# Patient Record
Sex: Male | Born: 1955
Health system: Southern US, Community
[De-identification: ages and names within clinical notes are randomized; demographics above are authoritative.]

## PROBLEM LIST (undated history)

## (undated) DIAGNOSIS — M1990 Unspecified osteoarthritis, unspecified site: Secondary | ICD-10-CM

## (undated) DIAGNOSIS — K219 Gastro-esophageal reflux disease without esophagitis: Secondary | ICD-10-CM

## (undated) DIAGNOSIS — M419 Scoliosis, unspecified: Secondary | ICD-10-CM

## (undated) DIAGNOSIS — E785 Hyperlipidemia, unspecified: Secondary | ICD-10-CM

## (undated) DIAGNOSIS — M810 Age-related osteoporosis without current pathological fracture: Secondary | ICD-10-CM

## (undated) DIAGNOSIS — I10 Essential (primary) hypertension: Secondary | ICD-10-CM

## (undated) DIAGNOSIS — J449 Chronic obstructive pulmonary disease, unspecified: Secondary | ICD-10-CM

## (undated) DIAGNOSIS — Z8619 Personal history of other infectious and parasitic diseases: Secondary | ICD-10-CM

## (undated) DIAGNOSIS — F411 Generalized anxiety disorder: Secondary | ICD-10-CM

## (undated) HISTORY — DX: Essential (primary) hypertension: I10

## (undated) HISTORY — PX: OTHER SURGICAL HISTORY: SHX169

## (undated) HISTORY — DX: Hyperlipidemia, unspecified: E78.5

## (undated) HISTORY — PX: CHOLECYSTECTOMY: SHX55

## (undated) HISTORY — DX: Gastro-esophageal reflux disease without esophagitis: K21.9

## (undated) HISTORY — DX: Personal history of other infectious and parasitic diseases: Z86.19

## (undated) HISTORY — DX: Generalized anxiety disorder: F41.1

---

## 2016-05-18 DIAGNOSIS — C7931 Secondary malignant neoplasm of brain: Secondary | ICD-10-CM | POA: Diagnosis not present

## 2016-05-18 DIAGNOSIS — C20 Malignant neoplasm of rectum: Secondary | ICD-10-CM | POA: Diagnosis not present

## 2016-05-19 DIAGNOSIS — C20 Malignant neoplasm of rectum: Secondary | ICD-10-CM | POA: Diagnosis not present

## 2016-05-19 DIAGNOSIS — C7931 Secondary malignant neoplasm of brain: Secondary | ICD-10-CM | POA: Diagnosis not present

## 2016-05-20 DIAGNOSIS — C20 Malignant neoplasm of rectum: Secondary | ICD-10-CM | POA: Diagnosis not present

## 2016-05-20 DIAGNOSIS — C7931 Secondary malignant neoplasm of brain: Secondary | ICD-10-CM | POA: Diagnosis not present

## 2016-05-21 DIAGNOSIS — C20 Malignant neoplasm of rectum: Secondary | ICD-10-CM | POA: Diagnosis not present

## 2016-05-21 DIAGNOSIS — C7931 Secondary malignant neoplasm of brain: Secondary | ICD-10-CM | POA: Diagnosis not present

## 2016-05-22 DIAGNOSIS — C20 Malignant neoplasm of rectum: Secondary | ICD-10-CM | POA: Diagnosis not present

## 2016-05-22 DIAGNOSIS — C7931 Secondary malignant neoplasm of brain: Secondary | ICD-10-CM | POA: Diagnosis not present

## 2016-05-23 DIAGNOSIS — C7931 Secondary malignant neoplasm of brain: Secondary | ICD-10-CM | POA: Diagnosis not present

## 2016-05-23 DIAGNOSIS — C20 Malignant neoplasm of rectum: Secondary | ICD-10-CM | POA: Diagnosis not present

## 2016-05-24 DIAGNOSIS — C20 Malignant neoplasm of rectum: Secondary | ICD-10-CM | POA: Diagnosis not present

## 2016-05-24 DIAGNOSIS — C7931 Secondary malignant neoplasm of brain: Secondary | ICD-10-CM | POA: Diagnosis not present

## 2016-05-25 DIAGNOSIS — C20 Malignant neoplasm of rectum: Secondary | ICD-10-CM | POA: Diagnosis not present

## 2016-05-25 DIAGNOSIS — C7931 Secondary malignant neoplasm of brain: Secondary | ICD-10-CM | POA: Diagnosis not present

## 2016-05-26 DIAGNOSIS — C20 Malignant neoplasm of rectum: Secondary | ICD-10-CM | POA: Diagnosis not present

## 2016-05-26 DIAGNOSIS — C7931 Secondary malignant neoplasm of brain: Secondary | ICD-10-CM | POA: Diagnosis not present

## 2016-05-27 DIAGNOSIS — C7931 Secondary malignant neoplasm of brain: Secondary | ICD-10-CM | POA: Diagnosis not present

## 2016-05-27 DIAGNOSIS — C20 Malignant neoplasm of rectum: Secondary | ICD-10-CM | POA: Diagnosis not present

## 2016-05-28 DIAGNOSIS — C20 Malignant neoplasm of rectum: Secondary | ICD-10-CM | POA: Diagnosis not present

## 2016-05-28 DIAGNOSIS — C7931 Secondary malignant neoplasm of brain: Secondary | ICD-10-CM | POA: Diagnosis not present

## 2016-05-29 DIAGNOSIS — C20 Malignant neoplasm of rectum: Secondary | ICD-10-CM | POA: Diagnosis not present

## 2016-05-29 DIAGNOSIS — C7931 Secondary malignant neoplasm of brain: Secondary | ICD-10-CM | POA: Diagnosis not present

## 2016-05-30 DIAGNOSIS — C20 Malignant neoplasm of rectum: Secondary | ICD-10-CM | POA: Diagnosis not present

## 2016-05-30 DIAGNOSIS — C7931 Secondary malignant neoplasm of brain: Secondary | ICD-10-CM | POA: Diagnosis not present

## 2016-05-31 DIAGNOSIS — C20 Malignant neoplasm of rectum: Secondary | ICD-10-CM | POA: Diagnosis not present

## 2016-05-31 DIAGNOSIS — C7931 Secondary malignant neoplasm of brain: Secondary | ICD-10-CM | POA: Diagnosis not present

## 2016-06-01 DIAGNOSIS — C7931 Secondary malignant neoplasm of brain: Secondary | ICD-10-CM | POA: Diagnosis not present

## 2016-06-01 DIAGNOSIS — C20 Malignant neoplasm of rectum: Secondary | ICD-10-CM | POA: Diagnosis not present

## 2016-06-02 DIAGNOSIS — C7931 Secondary malignant neoplasm of brain: Secondary | ICD-10-CM | POA: Diagnosis not present

## 2016-06-02 DIAGNOSIS — C20 Malignant neoplasm of rectum: Secondary | ICD-10-CM | POA: Diagnosis not present

## 2016-06-03 DIAGNOSIS — C20 Malignant neoplasm of rectum: Secondary | ICD-10-CM | POA: Diagnosis not present

## 2016-06-03 DIAGNOSIS — C7931 Secondary malignant neoplasm of brain: Secondary | ICD-10-CM | POA: Diagnosis not present

## 2016-06-04 DIAGNOSIS — C20 Malignant neoplasm of rectum: Secondary | ICD-10-CM | POA: Diagnosis not present

## 2016-06-04 DIAGNOSIS — C7931 Secondary malignant neoplasm of brain: Secondary | ICD-10-CM | POA: Diagnosis not present

## 2016-06-05 DIAGNOSIS — C20 Malignant neoplasm of rectum: Secondary | ICD-10-CM | POA: Diagnosis not present

## 2016-06-05 DIAGNOSIS — C7931 Secondary malignant neoplasm of brain: Secondary | ICD-10-CM | POA: Diagnosis not present

## 2016-06-06 DIAGNOSIS — C7931 Secondary malignant neoplasm of brain: Secondary | ICD-10-CM | POA: Diagnosis not present

## 2016-06-06 DIAGNOSIS — C20 Malignant neoplasm of rectum: Secondary | ICD-10-CM | POA: Diagnosis not present

## 2016-06-07 DIAGNOSIS — C7931 Secondary malignant neoplasm of brain: Secondary | ICD-10-CM | POA: Diagnosis not present

## 2016-06-07 DIAGNOSIS — C20 Malignant neoplasm of rectum: Secondary | ICD-10-CM | POA: Diagnosis not present

## 2016-06-08 DIAGNOSIS — C20 Malignant neoplasm of rectum: Secondary | ICD-10-CM | POA: Diagnosis not present

## 2016-06-08 DIAGNOSIS — C7931 Secondary malignant neoplasm of brain: Secondary | ICD-10-CM | POA: Diagnosis not present

## 2016-06-09 DIAGNOSIS — C20 Malignant neoplasm of rectum: Secondary | ICD-10-CM | POA: Diagnosis not present

## 2016-06-09 DIAGNOSIS — C7931 Secondary malignant neoplasm of brain: Secondary | ICD-10-CM | POA: Diagnosis not present

## 2016-06-10 DIAGNOSIS — C7931 Secondary malignant neoplasm of brain: Secondary | ICD-10-CM | POA: Diagnosis not present

## 2016-06-10 DIAGNOSIS — C20 Malignant neoplasm of rectum: Secondary | ICD-10-CM | POA: Diagnosis not present

## 2016-06-11 DIAGNOSIS — C20 Malignant neoplasm of rectum: Secondary | ICD-10-CM | POA: Diagnosis not present

## 2016-06-11 DIAGNOSIS — C7931 Secondary malignant neoplasm of brain: Secondary | ICD-10-CM | POA: Diagnosis not present

## 2016-06-12 DIAGNOSIS — C7931 Secondary malignant neoplasm of brain: Secondary | ICD-10-CM | POA: Diagnosis not present

## 2016-06-12 DIAGNOSIS — C20 Malignant neoplasm of rectum: Secondary | ICD-10-CM | POA: Diagnosis not present

## 2016-06-13 DIAGNOSIS — C20 Malignant neoplasm of rectum: Secondary | ICD-10-CM | POA: Diagnosis not present

## 2016-06-13 DIAGNOSIS — C7931 Secondary malignant neoplasm of brain: Secondary | ICD-10-CM | POA: Diagnosis not present

## 2016-06-14 DIAGNOSIS — C7931 Secondary malignant neoplasm of brain: Secondary | ICD-10-CM | POA: Diagnosis not present

## 2016-06-14 DIAGNOSIS — C20 Malignant neoplasm of rectum: Secondary | ICD-10-CM | POA: Diagnosis not present

## 2016-06-15 DIAGNOSIS — C20 Malignant neoplasm of rectum: Secondary | ICD-10-CM | POA: Diagnosis not present

## 2016-06-15 DIAGNOSIS — C7931 Secondary malignant neoplasm of brain: Secondary | ICD-10-CM | POA: Diagnosis not present

## 2016-06-16 DIAGNOSIS — C7931 Secondary malignant neoplasm of brain: Secondary | ICD-10-CM | POA: Diagnosis not present

## 2016-06-16 DIAGNOSIS — C20 Malignant neoplasm of rectum: Secondary | ICD-10-CM | POA: Diagnosis not present

## 2016-07-22 DIAGNOSIS — G8929 Other chronic pain: Secondary | ICD-10-CM | POA: Diagnosis not present

## 2016-07-22 DIAGNOSIS — Z5189 Encounter for other specified aftercare: Secondary | ICD-10-CM | POA: Diagnosis not present

## 2016-07-22 DIAGNOSIS — R262 Difficulty in walking, not elsewhere classified: Secondary | ICD-10-CM | POA: Diagnosis not present

## 2016-07-31 DIAGNOSIS — G8929 Other chronic pain: Secondary | ICD-10-CM | POA: Diagnosis not present

## 2016-07-31 DIAGNOSIS — Z5189 Encounter for other specified aftercare: Secondary | ICD-10-CM | POA: Diagnosis not present

## 2016-07-31 DIAGNOSIS — R262 Difficulty in walking, not elsewhere classified: Secondary | ICD-10-CM | POA: Diagnosis not present

## 2016-08-05 DIAGNOSIS — R262 Difficulty in walking, not elsewhere classified: Secondary | ICD-10-CM | POA: Diagnosis not present

## 2016-08-05 DIAGNOSIS — G8929 Other chronic pain: Secondary | ICD-10-CM | POA: Diagnosis not present

## 2016-08-05 DIAGNOSIS — Z5189 Encounter for other specified aftercare: Secondary | ICD-10-CM | POA: Diagnosis not present

## 2016-08-13 DIAGNOSIS — R262 Difficulty in walking, not elsewhere classified: Secondary | ICD-10-CM | POA: Diagnosis not present

## 2016-08-13 DIAGNOSIS — Z5189 Encounter for other specified aftercare: Secondary | ICD-10-CM | POA: Diagnosis not present

## 2016-08-13 DIAGNOSIS — G8929 Other chronic pain: Secondary | ICD-10-CM | POA: Diagnosis not present

## 2016-08-19 DIAGNOSIS — Z5189 Encounter for other specified aftercare: Secondary | ICD-10-CM | POA: Diagnosis not present

## 2016-08-19 DIAGNOSIS — G8929 Other chronic pain: Secondary | ICD-10-CM | POA: Diagnosis not present

## 2016-08-19 DIAGNOSIS — R262 Difficulty in walking, not elsewhere classified: Secondary | ICD-10-CM | POA: Diagnosis not present

## 2016-08-26 DIAGNOSIS — R262 Difficulty in walking, not elsewhere classified: Secondary | ICD-10-CM | POA: Diagnosis not present

## 2016-08-26 DIAGNOSIS — G8929 Other chronic pain: Secondary | ICD-10-CM | POA: Diagnosis not present

## 2016-08-26 DIAGNOSIS — Z5189 Encounter for other specified aftercare: Secondary | ICD-10-CM | POA: Diagnosis not present

## 2016-09-03 DIAGNOSIS — R262 Difficulty in walking, not elsewhere classified: Secondary | ICD-10-CM | POA: Diagnosis not present

## 2016-09-03 DIAGNOSIS — G8929 Other chronic pain: Secondary | ICD-10-CM | POA: Diagnosis not present

## 2016-09-03 DIAGNOSIS — Z5189 Encounter for other specified aftercare: Secondary | ICD-10-CM | POA: Diagnosis not present

## 2016-09-09 DIAGNOSIS — J449 Chronic obstructive pulmonary disease, unspecified: Secondary | ICD-10-CM | POA: Diagnosis not present

## 2016-09-09 DIAGNOSIS — G8929 Other chronic pain: Secondary | ICD-10-CM | POA: Diagnosis not present

## 2016-09-09 DIAGNOSIS — Z5189 Encounter for other specified aftercare: Secondary | ICD-10-CM | POA: Diagnosis not present

## 2016-09-09 DIAGNOSIS — R262 Difficulty in walking, not elsewhere classified: Secondary | ICD-10-CM | POA: Diagnosis not present

## 2016-09-09 DIAGNOSIS — R030 Elevated blood-pressure reading, without diagnosis of hypertension: Secondary | ICD-10-CM | POA: Diagnosis not present

## 2016-09-11 DIAGNOSIS — Z5189 Encounter for other specified aftercare: Secondary | ICD-10-CM | POA: Diagnosis not present

## 2016-09-11 DIAGNOSIS — G8929 Other chronic pain: Secondary | ICD-10-CM | POA: Diagnosis not present

## 2016-09-11 DIAGNOSIS — R262 Difficulty in walking, not elsewhere classified: Secondary | ICD-10-CM | POA: Diagnosis not present

## 2016-09-15 DIAGNOSIS — Z5189 Encounter for other specified aftercare: Secondary | ICD-10-CM | POA: Diagnosis not present

## 2016-09-15 DIAGNOSIS — R262 Difficulty in walking, not elsewhere classified: Secondary | ICD-10-CM | POA: Diagnosis not present

## 2016-09-15 DIAGNOSIS — G8929 Other chronic pain: Secondary | ICD-10-CM | POA: Diagnosis not present

## 2016-09-22 DIAGNOSIS — G8929 Other chronic pain: Secondary | ICD-10-CM | POA: Diagnosis not present

## 2016-09-22 DIAGNOSIS — R262 Difficulty in walking, not elsewhere classified: Secondary | ICD-10-CM | POA: Diagnosis not present

## 2016-09-24 DIAGNOSIS — R262 Difficulty in walking, not elsewhere classified: Secondary | ICD-10-CM | POA: Diagnosis not present

## 2016-09-24 DIAGNOSIS — G8929 Other chronic pain: Secondary | ICD-10-CM | POA: Diagnosis not present

## 2016-09-30 DIAGNOSIS — Z79891 Long term (current) use of opiate analgesic: Secondary | ICD-10-CM | POA: Diagnosis not present

## 2016-10-01 DIAGNOSIS — M25562 Pain in left knee: Secondary | ICD-10-CM | POA: Diagnosis not present

## 2016-10-01 DIAGNOSIS — R262 Difficulty in walking, not elsewhere classified: Secondary | ICD-10-CM | POA: Diagnosis not present

## 2016-10-01 DIAGNOSIS — M7989 Other specified soft tissue disorders: Secondary | ICD-10-CM | POA: Diagnosis not present

## 2016-10-01 DIAGNOSIS — M25531 Pain in right wrist: Secondary | ICD-10-CM | POA: Diagnosis not present

## 2016-10-01 DIAGNOSIS — M25561 Pain in right knee: Secondary | ICD-10-CM | POA: Diagnosis not present

## 2016-10-01 DIAGNOSIS — G8929 Other chronic pain: Secondary | ICD-10-CM | POA: Diagnosis not present

## 2016-10-06 DIAGNOSIS — G8929 Other chronic pain: Secondary | ICD-10-CM | POA: Diagnosis not present

## 2016-10-06 DIAGNOSIS — R262 Difficulty in walking, not elsewhere classified: Secondary | ICD-10-CM | POA: Diagnosis not present

## 2016-10-07 DIAGNOSIS — Z1389 Encounter for screening for other disorder: Secondary | ICD-10-CM | POA: Diagnosis not present

## 2016-10-07 DIAGNOSIS — Z79891 Long term (current) use of opiate analgesic: Secondary | ICD-10-CM | POA: Diagnosis not present

## 2016-10-07 DIAGNOSIS — G894 Chronic pain syndrome: Secondary | ICD-10-CM | POA: Diagnosis not present

## 2016-10-08 DIAGNOSIS — R262 Difficulty in walking, not elsewhere classified: Secondary | ICD-10-CM | POA: Diagnosis not present

## 2016-10-08 DIAGNOSIS — G8929 Other chronic pain: Secondary | ICD-10-CM | POA: Diagnosis not present

## 2016-10-13 DIAGNOSIS — G8929 Other chronic pain: Secondary | ICD-10-CM | POA: Diagnosis not present

## 2016-10-13 DIAGNOSIS — R262 Difficulty in walking, not elsewhere classified: Secondary | ICD-10-CM | POA: Diagnosis not present

## 2016-10-20 DIAGNOSIS — R262 Difficulty in walking, not elsewhere classified: Secondary | ICD-10-CM | POA: Diagnosis not present

## 2016-10-20 DIAGNOSIS — G8929 Other chronic pain: Secondary | ICD-10-CM | POA: Diagnosis not present

## 2016-10-27 DIAGNOSIS — R262 Difficulty in walking, not elsewhere classified: Secondary | ICD-10-CM | POA: Diagnosis not present

## 2016-10-27 DIAGNOSIS — G8929 Other chronic pain: Secondary | ICD-10-CM | POA: Diagnosis not present

## 2016-11-03 DIAGNOSIS — G8929 Other chronic pain: Secondary | ICD-10-CM | POA: Diagnosis not present

## 2016-11-03 DIAGNOSIS — R262 Difficulty in walking, not elsewhere classified: Secondary | ICD-10-CM | POA: Diagnosis not present

## 2016-11-04 DIAGNOSIS — M1712 Unilateral primary osteoarthritis, left knee: Secondary | ICD-10-CM | POA: Diagnosis not present

## 2016-11-04 DIAGNOSIS — M1711 Unilateral primary osteoarthritis, right knee: Secondary | ICD-10-CM | POA: Diagnosis not present

## 2016-11-11 DIAGNOSIS — R262 Difficulty in walking, not elsewhere classified: Secondary | ICD-10-CM | POA: Diagnosis not present

## 2016-11-11 DIAGNOSIS — G8929 Other chronic pain: Secondary | ICD-10-CM | POA: Diagnosis not present

## 2016-11-17 DIAGNOSIS — J029 Acute pharyngitis, unspecified: Secondary | ICD-10-CM | POA: Diagnosis not present

## 2016-11-25 DIAGNOSIS — R262 Difficulty in walking, not elsewhere classified: Secondary | ICD-10-CM | POA: Diagnosis not present

## 2016-11-25 DIAGNOSIS — G8929 Other chronic pain: Secondary | ICD-10-CM | POA: Diagnosis not present

## 2016-12-09 DIAGNOSIS — G8929 Other chronic pain: Secondary | ICD-10-CM | POA: Diagnosis not present

## 2016-12-09 DIAGNOSIS — R262 Difficulty in walking, not elsewhere classified: Secondary | ICD-10-CM | POA: Diagnosis not present

## 2016-12-11 DIAGNOSIS — R1310 Dysphagia, unspecified: Secondary | ICD-10-CM | POA: Diagnosis not present

## 2016-12-11 DIAGNOSIS — R030 Elevated blood-pressure reading, without diagnosis of hypertension: Secondary | ICD-10-CM | POA: Diagnosis not present

## 2016-12-11 DIAGNOSIS — G8929 Other chronic pain: Secondary | ICD-10-CM | POA: Diagnosis not present

## 2016-12-11 DIAGNOSIS — G4700 Insomnia, unspecified: Secondary | ICD-10-CM | POA: Diagnosis not present

## 2016-12-16 DIAGNOSIS — R262 Difficulty in walking, not elsewhere classified: Secondary | ICD-10-CM | POA: Diagnosis not present

## 2016-12-16 DIAGNOSIS — G8929 Other chronic pain: Secondary | ICD-10-CM | POA: Diagnosis not present

## 2016-12-23 DIAGNOSIS — G8929 Other chronic pain: Secondary | ICD-10-CM | POA: Diagnosis not present

## 2016-12-23 DIAGNOSIS — R262 Difficulty in walking, not elsewhere classified: Secondary | ICD-10-CM | POA: Diagnosis not present

## 2017-01-01 DIAGNOSIS — R1310 Dysphagia, unspecified: Secondary | ICD-10-CM | POA: Diagnosis not present

## 2017-01-01 DIAGNOSIS — Z86010 Personal history of colonic polyps: Secondary | ICD-10-CM | POA: Diagnosis not present

## 2017-01-05 DIAGNOSIS — R262 Difficulty in walking, not elsewhere classified: Secondary | ICD-10-CM | POA: Diagnosis not present

## 2017-01-05 DIAGNOSIS — G8929 Other chronic pain: Secondary | ICD-10-CM | POA: Diagnosis not present

## 2017-01-08 DIAGNOSIS — K222 Esophageal obstruction: Secondary | ICD-10-CM | POA: Diagnosis not present

## 2017-01-08 DIAGNOSIS — R1319 Other dysphagia: Secondary | ICD-10-CM | POA: Diagnosis not present

## 2017-01-14 DIAGNOSIS — R1319 Other dysphagia: Secondary | ICD-10-CM | POA: Diagnosis not present

## 2017-01-14 DIAGNOSIS — R1310 Dysphagia, unspecified: Secondary | ICD-10-CM | POA: Diagnosis not present

## 2017-01-14 DIAGNOSIS — R0989 Other specified symptoms and signs involving the circulatory and respiratory systems: Secondary | ICD-10-CM | POA: Diagnosis not present

## 2017-01-19 DIAGNOSIS — G8929 Other chronic pain: Secondary | ICD-10-CM | POA: Diagnosis not present

## 2017-01-19 DIAGNOSIS — R262 Difficulty in walking, not elsewhere classified: Secondary | ICD-10-CM | POA: Diagnosis not present

## 2017-01-20 DIAGNOSIS — Z87891 Personal history of nicotine dependence: Secondary | ICD-10-CM | POA: Diagnosis not present

## 2017-01-20 DIAGNOSIS — Z122 Encounter for screening for malignant neoplasm of respiratory organs: Secondary | ICD-10-CM | POA: Diagnosis not present

## 2017-01-30 DIAGNOSIS — M810 Age-related osteoporosis without current pathological fracture: Secondary | ICD-10-CM | POA: Diagnosis not present

## 2017-01-30 DIAGNOSIS — J438 Other emphysema: Secondary | ICD-10-CM | POA: Diagnosis not present

## 2017-01-30 DIAGNOSIS — Z01818 Encounter for other preprocedural examination: Secondary | ICD-10-CM | POA: Diagnosis not present

## 2017-01-30 DIAGNOSIS — R1319 Other dysphagia: Secondary | ICD-10-CM | POA: Diagnosis not present

## 2017-01-30 DIAGNOSIS — M8580 Other specified disorders of bone density and structure, unspecified site: Secondary | ICD-10-CM | POA: Diagnosis not present

## 2017-02-04 DIAGNOSIS — R1319 Other dysphagia: Secondary | ICD-10-CM | POA: Diagnosis not present

## 2017-02-04 DIAGNOSIS — B182 Chronic viral hepatitis C: Secondary | ICD-10-CM | POA: Diagnosis not present

## 2017-02-04 DIAGNOSIS — B1920 Unspecified viral hepatitis C without hepatic coma: Secondary | ICD-10-CM | POA: Diagnosis not present

## 2017-02-04 DIAGNOSIS — M8580 Other specified disorders of bone density and structure, unspecified site: Secondary | ICD-10-CM | POA: Diagnosis not present

## 2017-02-04 DIAGNOSIS — K8020 Calculus of gallbladder without cholecystitis without obstruction: Secondary | ICD-10-CM | POA: Diagnosis not present

## 2017-02-06 DIAGNOSIS — Z720 Tobacco use: Secondary | ICD-10-CM | POA: Diagnosis not present

## 2017-02-06 DIAGNOSIS — J449 Chronic obstructive pulmonary disease, unspecified: Secondary | ICD-10-CM | POA: Diagnosis not present

## 2017-02-06 DIAGNOSIS — R1319 Other dysphagia: Secondary | ICD-10-CM | POA: Diagnosis not present

## 2017-02-06 DIAGNOSIS — K2950 Unspecified chronic gastritis without bleeding: Secondary | ICD-10-CM | POA: Diagnosis not present

## 2017-02-06 DIAGNOSIS — R030 Elevated blood-pressure reading, without diagnosis of hypertension: Secondary | ICD-10-CM | POA: Diagnosis not present

## 2017-02-06 DIAGNOSIS — R1310 Dysphagia, unspecified: Secondary | ICD-10-CM | POA: Diagnosis not present

## 2017-02-06 DIAGNOSIS — G4700 Insomnia, unspecified: Secondary | ICD-10-CM | POA: Diagnosis not present

## 2017-02-06 DIAGNOSIS — M549 Dorsalgia, unspecified: Secondary | ICD-10-CM | POA: Diagnosis not present

## 2017-02-24 DIAGNOSIS — K2950 Unspecified chronic gastritis without bleeding: Secondary | ICD-10-CM | POA: Diagnosis not present

## 2017-02-24 DIAGNOSIS — K8020 Calculus of gallbladder without cholecystitis without obstruction: Secondary | ICD-10-CM | POA: Diagnosis not present

## 2017-02-24 DIAGNOSIS — M810 Age-related osteoporosis without current pathological fracture: Secondary | ICD-10-CM | POA: Diagnosis not present

## 2017-02-24 DIAGNOSIS — R1310 Dysphagia, unspecified: Secondary | ICD-10-CM | POA: Diagnosis not present

## 2017-02-25 DIAGNOSIS — M1712 Unilateral primary osteoarthritis, left knee: Secondary | ICD-10-CM | POA: Diagnosis not present

## 2017-02-25 DIAGNOSIS — M1711 Unilateral primary osteoarthritis, right knee: Secondary | ICD-10-CM | POA: Diagnosis not present

## 2017-03-03 DIAGNOSIS — B182 Chronic viral hepatitis C: Secondary | ICD-10-CM | POA: Diagnosis not present

## 2017-03-03 DIAGNOSIS — K8020 Calculus of gallbladder without cholecystitis without obstruction: Secondary | ICD-10-CM | POA: Diagnosis not present

## 2017-03-16 DIAGNOSIS — R1310 Dysphagia, unspecified: Secondary | ICD-10-CM | POA: Diagnosis not present

## 2017-03-16 DIAGNOSIS — G4700 Insomnia, unspecified: Secondary | ICD-10-CM | POA: Diagnosis not present

## 2017-03-16 DIAGNOSIS — I10 Essential (primary) hypertension: Secondary | ICD-10-CM | POA: Diagnosis not present

## 2017-03-16 DIAGNOSIS — M810 Age-related osteoporosis without current pathological fracture: Secondary | ICD-10-CM | POA: Diagnosis not present

## 2017-03-17 DIAGNOSIS — K8020 Calculus of gallbladder without cholecystitis without obstruction: Secondary | ICD-10-CM | POA: Diagnosis not present

## 2017-04-22 DIAGNOSIS — I10 Essential (primary) hypertension: Secondary | ICD-10-CM | POA: Diagnosis not present

## 2017-05-06 DIAGNOSIS — K819 Cholecystitis, unspecified: Secondary | ICD-10-CM | POA: Diagnosis not present

## 2017-05-06 DIAGNOSIS — G4700 Insomnia, unspecified: Secondary | ICD-10-CM | POA: Diagnosis not present

## 2017-05-06 DIAGNOSIS — K228 Other specified diseases of esophagus: Secondary | ICD-10-CM | POA: Diagnosis not present

## 2017-05-06 DIAGNOSIS — M5489 Other dorsalgia: Secondary | ICD-10-CM | POA: Diagnosis not present

## 2017-05-06 DIAGNOSIS — R1319 Other dysphagia: Secondary | ICD-10-CM | POA: Diagnosis not present

## 2017-05-06 DIAGNOSIS — K739 Chronic hepatitis, unspecified: Secondary | ICD-10-CM | POA: Diagnosis not present

## 2017-05-06 DIAGNOSIS — J449 Chronic obstructive pulmonary disease, unspecified: Secondary | ICD-10-CM | POA: Diagnosis not present

## 2017-05-06 DIAGNOSIS — R1011 Right upper quadrant pain: Secondary | ICD-10-CM | POA: Diagnosis not present

## 2017-05-06 DIAGNOSIS — K8010 Calculus of gallbladder with chronic cholecystitis without obstruction: Secondary | ICD-10-CM | POA: Diagnosis not present

## 2017-05-06 DIAGNOSIS — M810 Age-related osteoporosis without current pathological fracture: Secondary | ICD-10-CM | POA: Diagnosis not present

## 2017-05-06 DIAGNOSIS — K2970 Gastritis, unspecified, without bleeding: Secondary | ICD-10-CM | POA: Diagnosis not present

## 2017-05-06 DIAGNOSIS — Z720 Tobacco use: Secondary | ICD-10-CM | POA: Diagnosis not present

## 2017-05-06 DIAGNOSIS — K7469 Other cirrhosis of liver: Secondary | ICD-10-CM | POA: Diagnosis not present

## 2017-05-06 DIAGNOSIS — K7460 Unspecified cirrhosis of liver: Secondary | ICD-10-CM | POA: Diagnosis not present

## 2017-05-06 DIAGNOSIS — R39198 Other difficulties with micturition: Secondary | ICD-10-CM | POA: Diagnosis not present

## 2017-05-06 DIAGNOSIS — Z8619 Personal history of other infectious and parasitic diseases: Secondary | ICD-10-CM | POA: Diagnosis not present

## 2017-05-07 DIAGNOSIS — Z8619 Personal history of other infectious and parasitic diseases: Secondary | ICD-10-CM | POA: Diagnosis not present

## 2017-05-07 DIAGNOSIS — G4700 Insomnia, unspecified: Secondary | ICD-10-CM | POA: Diagnosis not present

## 2017-05-07 DIAGNOSIS — K7469 Other cirrhosis of liver: Secondary | ICD-10-CM | POA: Diagnosis not present

## 2017-05-07 DIAGNOSIS — R1319 Other dysphagia: Secondary | ICD-10-CM | POA: Diagnosis not present

## 2017-05-07 DIAGNOSIS — J449 Chronic obstructive pulmonary disease, unspecified: Secondary | ICD-10-CM | POA: Diagnosis not present

## 2017-05-07 DIAGNOSIS — M5489 Other dorsalgia: Secondary | ICD-10-CM | POA: Diagnosis not present

## 2017-05-07 DIAGNOSIS — K228 Other specified diseases of esophagus: Secondary | ICD-10-CM | POA: Diagnosis not present

## 2017-05-07 DIAGNOSIS — R39198 Other difficulties with micturition: Secondary | ICD-10-CM | POA: Diagnosis not present

## 2017-05-07 DIAGNOSIS — Z720 Tobacco use: Secondary | ICD-10-CM | POA: Diagnosis not present

## 2017-05-07 DIAGNOSIS — M810 Age-related osteoporosis without current pathological fracture: Secondary | ICD-10-CM | POA: Diagnosis not present

## 2017-05-07 DIAGNOSIS — K8010 Calculus of gallbladder with chronic cholecystitis without obstruction: Secondary | ICD-10-CM | POA: Diagnosis not present

## 2017-05-07 DIAGNOSIS — R1011 Right upper quadrant pain: Secondary | ICD-10-CM | POA: Diagnosis not present

## 2017-05-07 DIAGNOSIS — K2970 Gastritis, unspecified, without bleeding: Secondary | ICD-10-CM | POA: Diagnosis not present

## 2017-05-14 DIAGNOSIS — K8050 Calculus of bile duct without cholangitis or cholecystitis without obstruction: Secondary | ICD-10-CM | POA: Diagnosis not present

## 2017-05-25 DIAGNOSIS — K769 Liver disease, unspecified: Secondary | ICD-10-CM | POA: Diagnosis not present

## 2017-05-25 DIAGNOSIS — R140 Abdominal distension (gaseous): Secondary | ICD-10-CM | POA: Diagnosis not present

## 2017-05-25 DIAGNOSIS — K7460 Unspecified cirrhosis of liver: Secondary | ICD-10-CM | POA: Diagnosis not present

## 2017-05-25 DIAGNOSIS — K5900 Constipation, unspecified: Secondary | ICD-10-CM | POA: Diagnosis not present

## 2017-05-27 DIAGNOSIS — M545 Low back pain: Secondary | ICD-10-CM | POA: Diagnosis not present

## 2017-06-01 DIAGNOSIS — M170 Bilateral primary osteoarthritis of knee: Secondary | ICD-10-CM | POA: Diagnosis not present

## 2017-06-09 DIAGNOSIS — M170 Bilateral primary osteoarthritis of knee: Secondary | ICD-10-CM | POA: Diagnosis not present

## 2017-06-15 DIAGNOSIS — M170 Bilateral primary osteoarthritis of knee: Secondary | ICD-10-CM | POA: Diagnosis not present

## 2017-06-18 DIAGNOSIS — I10 Essential (primary) hypertension: Secondary | ICD-10-CM | POA: Diagnosis not present

## 2017-06-18 DIAGNOSIS — F17210 Nicotine dependence, cigarettes, uncomplicated: Secondary | ICD-10-CM | POA: Diagnosis not present

## 2017-06-18 DIAGNOSIS — J449 Chronic obstructive pulmonary disease, unspecified: Secondary | ICD-10-CM | POA: Diagnosis not present

## 2017-06-18 DIAGNOSIS — G8929 Other chronic pain: Secondary | ICD-10-CM | POA: Diagnosis not present

## 2017-07-06 DIAGNOSIS — Z8719 Personal history of other diseases of the digestive system: Secondary | ICD-10-CM | POA: Diagnosis not present

## 2017-07-06 DIAGNOSIS — Z8619 Personal history of other infectious and parasitic diseases: Secondary | ICD-10-CM | POA: Diagnosis not present

## 2017-07-06 DIAGNOSIS — K7460 Unspecified cirrhosis of liver: Secondary | ICD-10-CM | POA: Diagnosis not present

## 2017-07-06 DIAGNOSIS — K5900 Constipation, unspecified: Secondary | ICD-10-CM | POA: Diagnosis not present

## 2017-08-13 DIAGNOSIS — K7460 Unspecified cirrhosis of liver: Secondary | ICD-10-CM | POA: Diagnosis not present

## 2017-08-13 DIAGNOSIS — Z9049 Acquired absence of other specified parts of digestive tract: Secondary | ICD-10-CM | POA: Diagnosis not present

## 2017-08-13 DIAGNOSIS — K760 Fatty (change of) liver, not elsewhere classified: Secondary | ICD-10-CM | POA: Diagnosis not present

## 2017-09-18 DIAGNOSIS — M170 Bilateral primary osteoarthritis of knee: Secondary | ICD-10-CM | POA: Diagnosis not present

## 2017-10-05 DIAGNOSIS — K7460 Unspecified cirrhosis of liver: Secondary | ICD-10-CM | POA: Diagnosis not present

## 2017-10-05 DIAGNOSIS — K5900 Constipation, unspecified: Secondary | ICD-10-CM | POA: Diagnosis not present

## 2017-11-13 DIAGNOSIS — M545 Low back pain: Secondary | ICD-10-CM | POA: Diagnosis not present

## 2018-01-12 DIAGNOSIS — M170 Bilateral primary osteoarthritis of knee: Secondary | ICD-10-CM | POA: Diagnosis not present

## 2018-01-12 DIAGNOSIS — I10 Essential (primary) hypertension: Secondary | ICD-10-CM | POA: Diagnosis not present

## 2018-01-12 DIAGNOSIS — N529 Male erectile dysfunction, unspecified: Secondary | ICD-10-CM | POA: Diagnosis not present

## 2018-01-12 DIAGNOSIS — J449 Chronic obstructive pulmonary disease, unspecified: Secondary | ICD-10-CM | POA: Diagnosis not present

## 2018-01-12 DIAGNOSIS — R238 Other skin changes: Secondary | ICD-10-CM | POA: Diagnosis not present

## 2018-01-12 DIAGNOSIS — F17210 Nicotine dependence, cigarettes, uncomplicated: Secondary | ICD-10-CM | POA: Diagnosis not present

## 2018-02-09 DIAGNOSIS — M19011 Primary osteoarthritis, right shoulder: Secondary | ICD-10-CM | POA: Diagnosis not present

## 2018-02-09 DIAGNOSIS — M19012 Primary osteoarthritis, left shoulder: Secondary | ICD-10-CM | POA: Diagnosis not present

## 2018-02-16 DIAGNOSIS — Z9049 Acquired absence of other specified parts of digestive tract: Secondary | ICD-10-CM | POA: Diagnosis not present

## 2018-02-16 DIAGNOSIS — K7460 Unspecified cirrhosis of liver: Secondary | ICD-10-CM | POA: Diagnosis not present

## 2018-02-16 DIAGNOSIS — K760 Fatty (change of) liver, not elsewhere classified: Secondary | ICD-10-CM | POA: Diagnosis not present

## 2018-02-17 DIAGNOSIS — K5900 Constipation, unspecified: Secondary | ICD-10-CM | POA: Diagnosis not present

## 2018-02-17 DIAGNOSIS — K7460 Unspecified cirrhosis of liver: Secondary | ICD-10-CM | POA: Diagnosis not present

## 2018-03-12 DIAGNOSIS — Z0189 Encounter for other specified special examinations: Secondary | ICD-10-CM | POA: Diagnosis not present

## 2018-03-12 DIAGNOSIS — K7460 Unspecified cirrhosis of liver: Secondary | ICD-10-CM | POA: Diagnosis not present

## 2018-03-12 DIAGNOSIS — J449 Chronic obstructive pulmonary disease, unspecified: Secondary | ICD-10-CM | POA: Diagnosis not present

## 2018-03-12 DIAGNOSIS — I10 Essential (primary) hypertension: Secondary | ICD-10-CM | POA: Diagnosis not present

## 2018-03-29 DIAGNOSIS — I10 Essential (primary) hypertension: Secondary | ICD-10-CM | POA: Diagnosis not present

## 2018-03-29 DIAGNOSIS — J449 Chronic obstructive pulmonary disease, unspecified: Secondary | ICD-10-CM | POA: Diagnosis not present

## 2018-03-29 DIAGNOSIS — Z0189 Encounter for other specified special examinations: Secondary | ICD-10-CM | POA: Diagnosis not present

## 2018-03-29 DIAGNOSIS — K7460 Unspecified cirrhosis of liver: Secondary | ICD-10-CM | POA: Diagnosis not present

## 2018-03-29 DIAGNOSIS — M25569 Pain in unspecified knee: Secondary | ICD-10-CM | POA: Diagnosis not present

## 2018-04-02 DIAGNOSIS — Z0000 Encounter for general adult medical examination without abnormal findings: Secondary | ICD-10-CM | POA: Diagnosis not present

## 2018-04-02 DIAGNOSIS — I10 Essential (primary) hypertension: Secondary | ICD-10-CM | POA: Diagnosis not present

## 2018-04-02 DIAGNOSIS — M810 Age-related osteoporosis without current pathological fracture: Secondary | ICD-10-CM | POA: Diagnosis not present

## 2018-04-02 DIAGNOSIS — M25569 Pain in unspecified knee: Secondary | ICD-10-CM | POA: Diagnosis not present

## 2018-04-14 DIAGNOSIS — R10811 Right upper quadrant abdominal tenderness: Secondary | ICD-10-CM | POA: Diagnosis not present

## 2018-04-14 DIAGNOSIS — Z6827 Body mass index (BMI) 27.0-27.9, adult: Secondary | ICD-10-CM | POA: Diagnosis not present

## 2018-04-15 ENCOUNTER — Encounter: Payer: Self-pay | Admitting: Gastroenterology

## 2018-05-14 DIAGNOSIS — M7061 Trochanteric bursitis, right hip: Secondary | ICD-10-CM | POA: Diagnosis not present

## 2018-05-14 DIAGNOSIS — M7062 Trochanteric bursitis, left hip: Secondary | ICD-10-CM | POA: Diagnosis not present

## 2018-06-24 DIAGNOSIS — M2550 Pain in unspecified joint: Secondary | ICD-10-CM | POA: Diagnosis not present

## 2018-06-24 DIAGNOSIS — M1120 Other chondrocalcinosis, unspecified site: Secondary | ICD-10-CM | POA: Diagnosis not present

## 2018-07-01 DIAGNOSIS — I10 Essential (primary) hypertension: Secondary | ICD-10-CM | POA: Diagnosis not present

## 2018-07-01 DIAGNOSIS — M810 Age-related osteoporosis without current pathological fracture: Secondary | ICD-10-CM | POA: Diagnosis not present

## 2018-07-07 DIAGNOSIS — R7301 Impaired fasting glucose: Secondary | ICD-10-CM | POA: Diagnosis not present

## 2018-07-07 DIAGNOSIS — I10 Essential (primary) hypertension: Secondary | ICD-10-CM | POA: Diagnosis not present

## 2018-07-07 DIAGNOSIS — R3912 Poor urinary stream: Secondary | ICD-10-CM | POA: Diagnosis not present

## 2018-07-21 ENCOUNTER — Ambulatory Visit: Payer: Self-pay | Admitting: Nurse Practitioner

## 2018-07-29 DIAGNOSIS — M545 Low back pain: Secondary | ICD-10-CM | POA: Diagnosis not present

## 2018-07-29 DIAGNOSIS — Z79891 Long term (current) use of opiate analgesic: Secondary | ICD-10-CM | POA: Diagnosis not present

## 2018-07-29 DIAGNOSIS — G894 Chronic pain syndrome: Secondary | ICD-10-CM | POA: Diagnosis not present

## 2018-07-29 DIAGNOSIS — M25569 Pain in unspecified knee: Secondary | ICD-10-CM | POA: Diagnosis not present

## 2018-07-29 DIAGNOSIS — Z79899 Other long term (current) drug therapy: Secondary | ICD-10-CM | POA: Diagnosis not present

## 2018-07-29 DIAGNOSIS — M542 Cervicalgia: Secondary | ICD-10-CM | POA: Diagnosis not present

## 2018-08-26 DIAGNOSIS — Z79891 Long term (current) use of opiate analgesic: Secondary | ICD-10-CM | POA: Diagnosis not present

## 2018-08-26 DIAGNOSIS — Z79899 Other long term (current) drug therapy: Secondary | ICD-10-CM | POA: Diagnosis not present

## 2018-08-26 DIAGNOSIS — M545 Low back pain: Secondary | ICD-10-CM | POA: Diagnosis not present

## 2018-08-26 DIAGNOSIS — M546 Pain in thoracic spine: Secondary | ICD-10-CM | POA: Diagnosis not present

## 2018-08-26 DIAGNOSIS — G894 Chronic pain syndrome: Secondary | ICD-10-CM | POA: Diagnosis not present

## 2018-08-26 DIAGNOSIS — M7060 Trochanteric bursitis, unspecified hip: Secondary | ICD-10-CM | POA: Diagnosis not present

## 2018-08-30 ENCOUNTER — Other Ambulatory Visit (HOSPITAL_COMMUNITY): Payer: Self-pay | Admitting: Physician Assistant

## 2018-08-30 ENCOUNTER — Ambulatory Visit (HOSPITAL_COMMUNITY)
Admission: RE | Admit: 2018-08-30 | Discharge: 2018-08-30 | Disposition: A | Discharge status: Home / Self Care | Payer: Federal, State, Local not specified - PPO | Source: Ambulatory Visit | Attending: Physician Assistant | Admitting: Physician Assistant

## 2018-08-30 DIAGNOSIS — M542 Cervicalgia: Secondary | ICD-10-CM | POA: Insufficient documentation

## 2018-08-30 DIAGNOSIS — M545 Low back pain, unspecified: Secondary | ICD-10-CM

## 2018-08-30 DIAGNOSIS — M25512 Pain in left shoulder: Secondary | ICD-10-CM

## 2018-08-30 DIAGNOSIS — S199XXA Unspecified injury of neck, initial encounter: Secondary | ICD-10-CM | POA: Diagnosis not present

## 2018-08-30 DIAGNOSIS — S299XXA Unspecified injury of thorax, initial encounter: Secondary | ICD-10-CM | POA: Diagnosis not present

## 2018-08-30 DIAGNOSIS — S22081K Stable burst fracture of T11-T12 vertebra, subsequent encounter for fracture with nonunion: Secondary | ICD-10-CM | POA: Insufficient documentation

## 2018-08-30 DIAGNOSIS — S3992XA Unspecified injury of lower back, initial encounter: Secondary | ICD-10-CM | POA: Diagnosis not present

## 2018-08-30 DIAGNOSIS — M25511 Pain in right shoulder: Secondary | ICD-10-CM

## 2018-08-30 DIAGNOSIS — M546 Pain in thoracic spine: Secondary | ICD-10-CM | POA: Diagnosis not present

## 2018-08-30 DIAGNOSIS — S4991XA Unspecified injury of right shoulder and upper arm, initial encounter: Secondary | ICD-10-CM | POA: Diagnosis not present

## 2018-08-30 DIAGNOSIS — S4992XA Unspecified injury of left shoulder and upper arm, initial encounter: Secondary | ICD-10-CM | POA: Diagnosis not present

## 2018-09-07 DIAGNOSIS — G609 Hereditary and idiopathic neuropathy, unspecified: Secondary | ICD-10-CM | POA: Diagnosis not present

## 2018-09-07 DIAGNOSIS — Z79899 Other long term (current) drug therapy: Secondary | ICD-10-CM | POA: Diagnosis not present

## 2018-09-07 DIAGNOSIS — G894 Chronic pain syndrome: Secondary | ICD-10-CM | POA: Diagnosis not present

## 2018-09-07 DIAGNOSIS — M792 Neuralgia and neuritis, unspecified: Secondary | ICD-10-CM | POA: Diagnosis not present

## 2018-09-07 DIAGNOSIS — F4542 Pain disorder with related psychological factors: Secondary | ICD-10-CM | POA: Diagnosis not present

## 2018-09-07 DIAGNOSIS — Z79891 Long term (current) use of opiate analgesic: Secondary | ICD-10-CM | POA: Diagnosis not present

## 2018-09-15 DIAGNOSIS — M47817 Spondylosis without myelopathy or radiculopathy, lumbosacral region: Secondary | ICD-10-CM | POA: Diagnosis not present

## 2018-09-23 DIAGNOSIS — M25569 Pain in unspecified knee: Secondary | ICD-10-CM | POA: Diagnosis not present

## 2018-09-23 DIAGNOSIS — M25519 Pain in unspecified shoulder: Secondary | ICD-10-CM | POA: Diagnosis not present

## 2018-09-23 DIAGNOSIS — G894 Chronic pain syndrome: Secondary | ICD-10-CM | POA: Diagnosis not present

## 2018-09-23 DIAGNOSIS — M545 Low back pain: Secondary | ICD-10-CM | POA: Diagnosis not present

## 2018-10-21 DIAGNOSIS — M25519 Pain in unspecified shoulder: Secondary | ICD-10-CM | POA: Diagnosis not present

## 2018-10-21 DIAGNOSIS — Z79891 Long term (current) use of opiate analgesic: Secondary | ICD-10-CM | POA: Diagnosis not present

## 2018-10-21 DIAGNOSIS — Z79899 Other long term (current) drug therapy: Secondary | ICD-10-CM | POA: Diagnosis not present

## 2018-10-21 DIAGNOSIS — M545 Low back pain: Secondary | ICD-10-CM | POA: Diagnosis not present

## 2018-10-21 DIAGNOSIS — M7060 Trochanteric bursitis, unspecified hip: Secondary | ICD-10-CM | POA: Diagnosis not present

## 2018-10-21 DIAGNOSIS — G894 Chronic pain syndrome: Secondary | ICD-10-CM | POA: Diagnosis not present

## 2018-11-05 DIAGNOSIS — Z125 Encounter for screening for malignant neoplasm of prostate: Secondary | ICD-10-CM | POA: Diagnosis not present

## 2018-11-05 DIAGNOSIS — I10 Essential (primary) hypertension: Secondary | ICD-10-CM | POA: Diagnosis not present

## 2018-11-05 DIAGNOSIS — R7301 Impaired fasting glucose: Secondary | ICD-10-CM | POA: Diagnosis not present

## 2018-11-08 DIAGNOSIS — M47817 Spondylosis without myelopathy or radiculopathy, lumbosacral region: Secondary | ICD-10-CM | POA: Diagnosis not present

## 2018-11-08 DIAGNOSIS — M25522 Pain in left elbow: Secondary | ICD-10-CM | POA: Diagnosis not present

## 2018-11-08 DIAGNOSIS — M545 Low back pain: Secondary | ICD-10-CM | POA: Diagnosis not present

## 2018-11-08 DIAGNOSIS — S32000A Wedge compression fracture of unspecified lumbar vertebra, initial encounter for closed fracture: Secondary | ICD-10-CM | POA: Diagnosis not present

## 2018-11-10 DIAGNOSIS — R944 Abnormal results of kidney function studies: Secondary | ICD-10-CM | POA: Diagnosis not present

## 2018-11-10 DIAGNOSIS — Z136 Encounter for screening for cardiovascular disorders: Secondary | ICD-10-CM | POA: Diagnosis not present

## 2018-11-10 DIAGNOSIS — I10 Essential (primary) hypertension: Secondary | ICD-10-CM | POA: Diagnosis not present

## 2018-11-10 DIAGNOSIS — F17218 Nicotine dependence, cigarettes, with other nicotine-induced disorders: Secondary | ICD-10-CM | POA: Diagnosis not present

## 2018-11-18 DIAGNOSIS — Z79899 Other long term (current) drug therapy: Secondary | ICD-10-CM | POA: Diagnosis not present

## 2018-11-18 DIAGNOSIS — M25529 Pain in unspecified elbow: Secondary | ICD-10-CM | POA: Diagnosis not present

## 2018-11-18 DIAGNOSIS — G894 Chronic pain syndrome: Secondary | ICD-10-CM | POA: Diagnosis not present

## 2018-11-18 DIAGNOSIS — Z79891 Long term (current) use of opiate analgesic: Secondary | ICD-10-CM | POA: Diagnosis not present

## 2018-11-18 DIAGNOSIS — M545 Low back pain: Secondary | ICD-10-CM | POA: Diagnosis not present

## 2018-11-18 DIAGNOSIS — M7060 Trochanteric bursitis, unspecified hip: Secondary | ICD-10-CM | POA: Diagnosis not present

## 2018-11-18 DIAGNOSIS — M25519 Pain in unspecified shoulder: Secondary | ICD-10-CM | POA: Diagnosis not present

## 2018-12-16 DIAGNOSIS — G894 Chronic pain syndrome: Secondary | ICD-10-CM | POA: Diagnosis not present

## 2018-12-16 DIAGNOSIS — M25529 Pain in unspecified elbow: Secondary | ICD-10-CM | POA: Diagnosis not present

## 2018-12-16 DIAGNOSIS — M7060 Trochanteric bursitis, unspecified hip: Secondary | ICD-10-CM | POA: Diagnosis not present

## 2018-12-16 DIAGNOSIS — M546 Pain in thoracic spine: Secondary | ICD-10-CM | POA: Diagnosis not present

## 2019-04-22 DIAGNOSIS — E875 Hyperkalemia: Secondary | ICD-10-CM | POA: Diagnosis not present

## 2019-04-22 DIAGNOSIS — R944 Abnormal results of kidney function studies: Secondary | ICD-10-CM | POA: Diagnosis not present

## 2019-04-22 DIAGNOSIS — R3912 Poor urinary stream: Secondary | ICD-10-CM | POA: Diagnosis not present

## 2019-04-22 DIAGNOSIS — I10 Essential (primary) hypertension: Secondary | ICD-10-CM | POA: Diagnosis not present

## 2019-05-10 DIAGNOSIS — R7301 Impaired fasting glucose: Secondary | ICD-10-CM | POA: Diagnosis not present

## 2019-05-10 DIAGNOSIS — I10 Essential (primary) hypertension: Secondary | ICD-10-CM | POA: Diagnosis not present

## 2019-05-17 DIAGNOSIS — R7301 Impaired fasting glucose: Secondary | ICD-10-CM | POA: Diagnosis not present

## 2019-05-17 DIAGNOSIS — Z23 Encounter for immunization: Secondary | ICD-10-CM | POA: Diagnosis not present

## 2019-05-17 DIAGNOSIS — I10 Essential (primary) hypertension: Secondary | ICD-10-CM | POA: Diagnosis not present

## 2019-05-17 DIAGNOSIS — Z136 Encounter for screening for cardiovascular disorders: Secondary | ICD-10-CM | POA: Diagnosis not present

## 2019-05-17 DIAGNOSIS — R944 Abnormal results of kidney function studies: Secondary | ICD-10-CM | POA: Diagnosis not present

## 2019-05-23 DIAGNOSIS — I10 Essential (primary) hypertension: Secondary | ICD-10-CM | POA: Diagnosis not present

## 2019-05-23 DIAGNOSIS — K7460 Unspecified cirrhosis of liver: Secondary | ICD-10-CM | POA: Diagnosis not present

## 2019-05-23 DIAGNOSIS — Z0189 Encounter for other specified special examinations: Secondary | ICD-10-CM | POA: Diagnosis not present

## 2019-05-23 DIAGNOSIS — M25569 Pain in unspecified knee: Secondary | ICD-10-CM | POA: Diagnosis not present

## 2019-05-23 DIAGNOSIS — J449 Chronic obstructive pulmonary disease, unspecified: Secondary | ICD-10-CM | POA: Diagnosis not present

## 2019-06-13 DIAGNOSIS — I10 Essential (primary) hypertension: Secondary | ICD-10-CM | POA: Diagnosis not present

## 2019-06-13 DIAGNOSIS — R7301 Impaired fasting glucose: Secondary | ICD-10-CM | POA: Diagnosis not present

## 2019-06-15 DIAGNOSIS — D45 Polycythemia vera: Secondary | ICD-10-CM | POA: Diagnosis not present

## 2019-07-05 DIAGNOSIS — R3912 Poor urinary stream: Secondary | ICD-10-CM | POA: Diagnosis not present

## 2019-07-05 DIAGNOSIS — E875 Hyperkalemia: Secondary | ICD-10-CM | POA: Diagnosis not present

## 2019-07-05 DIAGNOSIS — R944 Abnormal results of kidney function studies: Secondary | ICD-10-CM | POA: Diagnosis not present

## 2019-07-05 DIAGNOSIS — I10 Essential (primary) hypertension: Secondary | ICD-10-CM | POA: Diagnosis not present

## 2019-07-26 DIAGNOSIS — E875 Hyperkalemia: Secondary | ICD-10-CM | POA: Diagnosis not present

## 2019-07-26 DIAGNOSIS — I10 Essential (primary) hypertension: Secondary | ICD-10-CM | POA: Diagnosis not present

## 2019-08-29 DIAGNOSIS — I10 Essential (primary) hypertension: Secondary | ICD-10-CM | POA: Diagnosis not present

## 2019-08-29 DIAGNOSIS — E875 Hyperkalemia: Secondary | ICD-10-CM | POA: Diagnosis not present

## 2019-09-28 DIAGNOSIS — I10 Essential (primary) hypertension: Secondary | ICD-10-CM | POA: Diagnosis not present

## 2019-09-28 DIAGNOSIS — E875 Hyperkalemia: Secondary | ICD-10-CM | POA: Diagnosis not present

## 2019-10-31 DIAGNOSIS — R3912 Poor urinary stream: Secondary | ICD-10-CM | POA: Diagnosis not present

## 2019-10-31 DIAGNOSIS — I10 Essential (primary) hypertension: Secondary | ICD-10-CM | POA: Diagnosis not present

## 2019-10-31 DIAGNOSIS — R7301 Impaired fasting glucose: Secondary | ICD-10-CM | POA: Diagnosis not present

## 2019-11-01 DIAGNOSIS — M810 Age-related osteoporosis without current pathological fracture: Secondary | ICD-10-CM | POA: Diagnosis not present

## 2019-11-01 DIAGNOSIS — K7460 Unspecified cirrhosis of liver: Secondary | ICD-10-CM | POA: Diagnosis not present

## 2019-11-01 DIAGNOSIS — I10 Essential (primary) hypertension: Secondary | ICD-10-CM | POA: Diagnosis not present

## 2019-11-01 DIAGNOSIS — J449 Chronic obstructive pulmonary disease, unspecified: Secondary | ICD-10-CM | POA: Diagnosis not present

## 2019-11-02 DIAGNOSIS — R945 Abnormal results of liver function studies: Secondary | ICD-10-CM | POA: Diagnosis not present

## 2019-11-02 DIAGNOSIS — D45 Polycythemia vera: Secondary | ICD-10-CM | POA: Diagnosis not present

## 2019-11-02 DIAGNOSIS — G894 Chronic pain syndrome: Secondary | ICD-10-CM | POA: Diagnosis not present

## 2019-11-02 DIAGNOSIS — Z0001 Encounter for general adult medical examination with abnormal findings: Secondary | ICD-10-CM | POA: Diagnosis not present

## 2019-11-02 DIAGNOSIS — R7301 Impaired fasting glucose: Secondary | ICD-10-CM | POA: Diagnosis not present

## 2019-11-02 DIAGNOSIS — R2689 Other abnormalities of gait and mobility: Secondary | ICD-10-CM | POA: Diagnosis not present

## 2019-11-24 DIAGNOSIS — K7460 Unspecified cirrhosis of liver: Secondary | ICD-10-CM | POA: Diagnosis not present

## 2019-11-24 DIAGNOSIS — M810 Age-related osteoporosis without current pathological fracture: Secondary | ICD-10-CM | POA: Diagnosis not present

## 2019-11-24 DIAGNOSIS — I10 Essential (primary) hypertension: Secondary | ICD-10-CM | POA: Diagnosis not present

## 2019-11-24 DIAGNOSIS — J449 Chronic obstructive pulmonary disease, unspecified: Secondary | ICD-10-CM | POA: Diagnosis not present

## 2019-12-27 DIAGNOSIS — J449 Chronic obstructive pulmonary disease, unspecified: Secondary | ICD-10-CM | POA: Diagnosis not present

## 2019-12-27 DIAGNOSIS — M810 Age-related osteoporosis without current pathological fracture: Secondary | ICD-10-CM | POA: Diagnosis not present

## 2019-12-27 DIAGNOSIS — K7460 Unspecified cirrhosis of liver: Secondary | ICD-10-CM | POA: Diagnosis not present

## 2019-12-27 DIAGNOSIS — I10 Essential (primary) hypertension: Secondary | ICD-10-CM | POA: Diagnosis not present

## 2020-01-27 DIAGNOSIS — K7460 Unspecified cirrhosis of liver: Secondary | ICD-10-CM | POA: Diagnosis not present

## 2020-01-27 DIAGNOSIS — J449 Chronic obstructive pulmonary disease, unspecified: Secondary | ICD-10-CM | POA: Diagnosis not present

## 2020-01-27 DIAGNOSIS — M810 Age-related osteoporosis without current pathological fracture: Secondary | ICD-10-CM | POA: Diagnosis not present

## 2020-01-27 DIAGNOSIS — I10 Essential (primary) hypertension: Secondary | ICD-10-CM | POA: Diagnosis not present

## 2020-01-31 DIAGNOSIS — R7301 Impaired fasting glucose: Secondary | ICD-10-CM | POA: Diagnosis not present

## 2020-01-31 DIAGNOSIS — D45 Polycythemia vera: Secondary | ICD-10-CM | POA: Diagnosis not present

## 2020-02-08 DIAGNOSIS — R7301 Impaired fasting glucose: Secondary | ICD-10-CM | POA: Diagnosis not present

## 2020-02-08 DIAGNOSIS — D45 Polycythemia vera: Secondary | ICD-10-CM | POA: Diagnosis not present

## 2020-02-08 DIAGNOSIS — I10 Essential (primary) hypertension: Secondary | ICD-10-CM | POA: Diagnosis not present

## 2020-02-08 DIAGNOSIS — R945 Abnormal results of liver function studies: Secondary | ICD-10-CM | POA: Diagnosis not present

## 2020-02-08 DIAGNOSIS — R2689 Other abnormalities of gait and mobility: Secondary | ICD-10-CM | POA: Diagnosis not present

## 2020-02-28 DIAGNOSIS — J449 Chronic obstructive pulmonary disease, unspecified: Secondary | ICD-10-CM | POA: Diagnosis not present

## 2020-02-28 DIAGNOSIS — K7460 Unspecified cirrhosis of liver: Secondary | ICD-10-CM | POA: Diagnosis not present

## 2020-02-28 DIAGNOSIS — M810 Age-related osteoporosis without current pathological fracture: Secondary | ICD-10-CM | POA: Diagnosis not present

## 2020-02-28 DIAGNOSIS — I10 Essential (primary) hypertension: Secondary | ICD-10-CM | POA: Diagnosis not present

## 2020-03-29 DIAGNOSIS — M810 Age-related osteoporosis without current pathological fracture: Secondary | ICD-10-CM | POA: Diagnosis not present

## 2020-03-29 DIAGNOSIS — I10 Essential (primary) hypertension: Secondary | ICD-10-CM | POA: Diagnosis not present

## 2020-03-29 DIAGNOSIS — J449 Chronic obstructive pulmonary disease, unspecified: Secondary | ICD-10-CM | POA: Diagnosis not present

## 2020-03-29 DIAGNOSIS — K7460 Unspecified cirrhosis of liver: Secondary | ICD-10-CM | POA: Diagnosis not present

## 2020-05-07 DIAGNOSIS — M810 Age-related osteoporosis without current pathological fracture: Secondary | ICD-10-CM | POA: Diagnosis not present

## 2020-05-07 DIAGNOSIS — K7460 Unspecified cirrhosis of liver: Secondary | ICD-10-CM | POA: Diagnosis not present

## 2020-05-07 DIAGNOSIS — I10 Essential (primary) hypertension: Secondary | ICD-10-CM | POA: Diagnosis not present

## 2020-05-07 DIAGNOSIS — J449 Chronic obstructive pulmonary disease, unspecified: Secondary | ICD-10-CM | POA: Diagnosis not present

## 2020-05-16 ENCOUNTER — Ambulatory Visit (HOSPITAL_COMMUNITY)
Admission: RE | Admit: 2020-05-16 | Discharge: 2020-05-16 | Disposition: A | Discharge status: Home / Self Care | Payer: Medicare Other | Source: Ambulatory Visit | Attending: Internal Medicine | Admitting: Internal Medicine

## 2020-05-16 ENCOUNTER — Other Ambulatory Visit: Payer: Self-pay

## 2020-05-16 ENCOUNTER — Other Ambulatory Visit (HOSPITAL_COMMUNITY): Payer: Self-pay | Admitting: Internal Medicine

## 2020-05-16 DIAGNOSIS — W19XXXA Unspecified fall, initial encounter: Secondary | ICD-10-CM

## 2020-05-16 DIAGNOSIS — M546 Pain in thoracic spine: Secondary | ICD-10-CM | POA: Insufficient documentation

## 2020-05-16 DIAGNOSIS — M545 Low back pain: Secondary | ICD-10-CM | POA: Insufficient documentation

## 2020-05-16 DIAGNOSIS — M542 Cervicalgia: Secondary | ICD-10-CM | POA: Diagnosis not present

## 2020-05-16 DIAGNOSIS — I700 Atherosclerosis of aorta: Secondary | ICD-10-CM | POA: Insufficient documentation

## 2020-05-16 DIAGNOSIS — M8448XD Pathological fracture, other site, subsequent encounter for fracture with routine healing: Secondary | ICD-10-CM | POA: Diagnosis not present

## 2020-05-16 DIAGNOSIS — M810 Age-related osteoporosis without current pathological fracture: Secondary | ICD-10-CM | POA: Diagnosis not present

## 2020-05-16 DIAGNOSIS — S22060A Wedge compression fracture of T7-T8 vertebra, initial encounter for closed fracture: Secondary | ICD-10-CM | POA: Diagnosis not present

## 2020-05-16 DIAGNOSIS — S22050A Wedge compression fracture of T5-T6 vertebra, initial encounter for closed fracture: Secondary | ICD-10-CM | POA: Diagnosis not present

## 2020-05-21 DIAGNOSIS — Z712 Person consulting for explanation of examination or test findings: Secondary | ICD-10-CM | POA: Diagnosis not present

## 2020-05-21 DIAGNOSIS — M810 Age-related osteoporosis without current pathological fracture: Secondary | ICD-10-CM | POA: Diagnosis not present

## 2020-05-21 DIAGNOSIS — F17218 Nicotine dependence, cigarettes, with other nicotine-induced disorders: Secondary | ICD-10-CM | POA: Diagnosis not present

## 2020-05-21 DIAGNOSIS — J449 Chronic obstructive pulmonary disease, unspecified: Secondary | ICD-10-CM | POA: Diagnosis not present

## 2020-05-21 DIAGNOSIS — R3912 Poor urinary stream: Secondary | ICD-10-CM | POA: Diagnosis not present

## 2020-05-21 DIAGNOSIS — K7460 Unspecified cirrhosis of liver: Secondary | ICD-10-CM | POA: Diagnosis not present

## 2020-05-21 DIAGNOSIS — R7301 Impaired fasting glucose: Secondary | ICD-10-CM | POA: Diagnosis not present

## 2020-05-21 DIAGNOSIS — I10 Essential (primary) hypertension: Secondary | ICD-10-CM | POA: Diagnosis not present

## 2020-05-24 DIAGNOSIS — D45 Polycythemia vera: Secondary | ICD-10-CM | POA: Diagnosis not present

## 2020-05-24 DIAGNOSIS — I10 Essential (primary) hypertension: Secondary | ICD-10-CM | POA: Diagnosis not present

## 2020-05-24 DIAGNOSIS — R945 Abnormal results of liver function studies: Secondary | ICD-10-CM | POA: Diagnosis not present

## 2020-05-24 DIAGNOSIS — R7301 Impaired fasting glucose: Secondary | ICD-10-CM | POA: Diagnosis not present

## 2020-05-24 DIAGNOSIS — R2689 Other abnormalities of gait and mobility: Secondary | ICD-10-CM | POA: Diagnosis not present

## 2020-06-11 DIAGNOSIS — R197 Diarrhea, unspecified: Secondary | ICD-10-CM | POA: Diagnosis not present

## 2020-06-11 DIAGNOSIS — R519 Headache, unspecified: Secondary | ICD-10-CM | POA: Diagnosis not present

## 2020-06-11 DIAGNOSIS — J3489 Other specified disorders of nose and nasal sinuses: Secondary | ICD-10-CM | POA: Diagnosis not present

## 2020-06-25 DIAGNOSIS — K7460 Unspecified cirrhosis of liver: Secondary | ICD-10-CM | POA: Diagnosis not present

## 2020-06-25 DIAGNOSIS — I10 Essential (primary) hypertension: Secondary | ICD-10-CM | POA: Diagnosis not present

## 2020-06-25 DIAGNOSIS — J449 Chronic obstructive pulmonary disease, unspecified: Secondary | ICD-10-CM | POA: Diagnosis not present

## 2020-06-25 DIAGNOSIS — M810 Age-related osteoporosis without current pathological fracture: Secondary | ICD-10-CM | POA: Diagnosis not present

## 2020-06-28 DIAGNOSIS — R7301 Impaired fasting glucose: Secondary | ICD-10-CM | POA: Diagnosis not present

## 2020-06-28 DIAGNOSIS — F17218 Nicotine dependence, cigarettes, with other nicotine-induced disorders: Secondary | ICD-10-CM | POA: Diagnosis not present

## 2020-06-28 DIAGNOSIS — Z712 Person consulting for explanation of examination or test findings: Secondary | ICD-10-CM | POA: Diagnosis not present

## 2020-06-28 DIAGNOSIS — R3912 Poor urinary stream: Secondary | ICD-10-CM | POA: Diagnosis not present

## 2020-08-17 DIAGNOSIS — I10 Essential (primary) hypertension: Secondary | ICD-10-CM | POA: Diagnosis not present

## 2020-08-17 DIAGNOSIS — J449 Chronic obstructive pulmonary disease, unspecified: Secondary | ICD-10-CM | POA: Diagnosis not present

## 2020-08-17 DIAGNOSIS — K7460 Unspecified cirrhosis of liver: Secondary | ICD-10-CM | POA: Diagnosis not present

## 2020-09-04 DIAGNOSIS — E559 Vitamin D deficiency, unspecified: Secondary | ICD-10-CM | POA: Diagnosis not present

## 2020-09-04 DIAGNOSIS — R519 Headache, unspecified: Secondary | ICD-10-CM | POA: Diagnosis not present

## 2020-09-04 DIAGNOSIS — R197 Diarrhea, unspecified: Secondary | ICD-10-CM | POA: Diagnosis not present

## 2020-09-04 DIAGNOSIS — J3489 Other specified disorders of nose and nasal sinuses: Secondary | ICD-10-CM | POA: Diagnosis not present

## 2020-09-04 DIAGNOSIS — R3912 Poor urinary stream: Secondary | ICD-10-CM | POA: Diagnosis not present

## 2020-09-04 DIAGNOSIS — R7301 Impaired fasting glucose: Secondary | ICD-10-CM | POA: Diagnosis not present

## 2020-09-04 DIAGNOSIS — Z712 Person consulting for explanation of examination or test findings: Secondary | ICD-10-CM | POA: Diagnosis not present

## 2020-09-04 DIAGNOSIS — J449 Chronic obstructive pulmonary disease, unspecified: Secondary | ICD-10-CM | POA: Diagnosis not present

## 2020-09-04 DIAGNOSIS — I10 Essential (primary) hypertension: Secondary | ICD-10-CM | POA: Diagnosis not present

## 2020-09-04 DIAGNOSIS — R69 Illness, unspecified: Secondary | ICD-10-CM | POA: Diagnosis not present

## 2020-09-06 ENCOUNTER — Other Ambulatory Visit (HOSPITAL_COMMUNITY): Payer: Self-pay | Admitting: Internal Medicine

## 2020-09-06 DIAGNOSIS — Z13820 Encounter for screening for osteoporosis: Secondary | ICD-10-CM
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
## 2020-09-24 ENCOUNTER — Other Ambulatory Visit: Payer: Self-pay

## 2020-09-24 ENCOUNTER — Ambulatory Visit (HOSPITAL_COMMUNITY)
Admission: RE | Admit: 2020-09-24 | Discharge: 2020-09-24 | Disposition: A | Discharge status: Home / Self Care | Payer: Medicare HMO | Source: Ambulatory Visit | Attending: Internal Medicine | Admitting: Internal Medicine

## 2020-09-24 DIAGNOSIS — M810 Age-related osteoporosis without current pathological fracture: Secondary | ICD-10-CM | POA: Diagnosis not present

## 2020-09-24 DIAGNOSIS — Z13820 Encounter for screening for osteoporosis: Secondary | ICD-10-CM | POA: Diagnosis not present

## 2020-12-16 DIAGNOSIS — K219 Gastro-esophageal reflux disease without esophagitis: Secondary | ICD-10-CM | POA: Diagnosis not present

## 2020-12-16 DIAGNOSIS — I10 Essential (primary) hypertension: Secondary | ICD-10-CM | POA: Diagnosis not present

## 2021-03-15 DIAGNOSIS — Z125 Encounter for screening for malignant neoplasm of prostate: Secondary | ICD-10-CM | POA: Diagnosis not present

## 2021-03-15 DIAGNOSIS — R7301 Impaired fasting glucose: Secondary | ICD-10-CM | POA: Diagnosis not present

## 2021-03-15 DIAGNOSIS — E559 Vitamin D deficiency, unspecified: Secondary | ICD-10-CM | POA: Diagnosis not present

## 2021-03-15 DIAGNOSIS — I10 Essential (primary) hypertension: Secondary | ICD-10-CM | POA: Diagnosis not present

## 2021-03-20 DIAGNOSIS — Z0001 Encounter for general adult medical examination with abnormal findings: Secondary | ICD-10-CM | POA: Diagnosis not present

## 2021-03-20 DIAGNOSIS — R197 Diarrhea, unspecified: Secondary | ICD-10-CM | POA: Diagnosis not present

## 2021-03-20 DIAGNOSIS — R69 Illness, unspecified: Secondary | ICD-10-CM | POA: Diagnosis not present

## 2021-03-20 DIAGNOSIS — I10 Essential (primary) hypertension: Secondary | ICD-10-CM | POA: Diagnosis not present

## 2021-03-20 DIAGNOSIS — E559 Vitamin D deficiency, unspecified: Secondary | ICD-10-CM | POA: Diagnosis not present

## 2021-03-20 DIAGNOSIS — G894 Chronic pain syndrome: Secondary | ICD-10-CM | POA: Diagnosis not present

## 2021-03-20 DIAGNOSIS — D751 Secondary polycythemia: Secondary | ICD-10-CM | POA: Diagnosis not present

## 2021-03-20 DIAGNOSIS — J449 Chronic obstructive pulmonary disease, unspecified: Secondary | ICD-10-CM | POA: Diagnosis not present

## 2021-03-20 DIAGNOSIS — R7301 Impaired fasting glucose: Secondary | ICD-10-CM | POA: Diagnosis not present

## 2021-03-20 DIAGNOSIS — D72828 Other elevated white blood cell count: Secondary | ICD-10-CM | POA: Diagnosis not present

## 2021-03-26 ENCOUNTER — Encounter: Payer: Self-pay | Admitting: Internal Medicine

## 2021-04-09 DIAGNOSIS — H5200 Hypermetropia, unspecified eye: Secondary | ICD-10-CM | POA: Diagnosis not present

## 2021-04-17 DIAGNOSIS — I10 Essential (primary) hypertension: Secondary | ICD-10-CM | POA: Diagnosis not present

## 2021-04-17 DIAGNOSIS — E785 Hyperlipidemia, unspecified: Secondary | ICD-10-CM | POA: Diagnosis not present

## 2021-05-17 DIAGNOSIS — I10 Essential (primary) hypertension: Secondary | ICD-10-CM | POA: Diagnosis not present

## 2021-05-17 DIAGNOSIS — E785 Hyperlipidemia, unspecified: Secondary | ICD-10-CM | POA: Diagnosis not present

## 2021-05-21 DIAGNOSIS — H524 Presbyopia: Secondary | ICD-10-CM | POA: Diagnosis not present

## 2021-05-21 DIAGNOSIS — H52223 Regular astigmatism, bilateral: Secondary | ICD-10-CM | POA: Diagnosis not present

## 2021-06-17 DIAGNOSIS — E785 Hyperlipidemia, unspecified: Secondary | ICD-10-CM | POA: Diagnosis not present

## 2021-06-17 DIAGNOSIS — I10 Essential (primary) hypertension: Secondary | ICD-10-CM | POA: Diagnosis not present

## 2021-06-28 DIAGNOSIS — G894 Chronic pain syndrome: Secondary | ICD-10-CM | POA: Diagnosis not present

## 2021-06-28 DIAGNOSIS — J302 Other seasonal allergic rhinitis: Secondary | ICD-10-CM | POA: Diagnosis not present

## 2021-06-28 DIAGNOSIS — R0781 Pleurodynia: Secondary | ICD-10-CM | POA: Diagnosis not present

## 2021-07-17 DIAGNOSIS — E782 Mixed hyperlipidemia: Secondary | ICD-10-CM | POA: Diagnosis not present

## 2021-07-17 DIAGNOSIS — I10 Essential (primary) hypertension: Secondary | ICD-10-CM | POA: Diagnosis not present

## 2021-07-23 ENCOUNTER — Ambulatory Visit: Payer: Medicare HMO | Admitting: Gastroenterology

## 2021-09-17 DIAGNOSIS — E785 Hyperlipidemia, unspecified: Secondary | ICD-10-CM | POA: Diagnosis not present

## 2021-09-17 DIAGNOSIS — I10 Essential (primary) hypertension: Secondary | ICD-10-CM | POA: Diagnosis not present

## 2021-09-19 DIAGNOSIS — I10 Essential (primary) hypertension: Secondary | ICD-10-CM | POA: Diagnosis not present

## 2021-09-19 DIAGNOSIS — E559 Vitamin D deficiency, unspecified: Secondary | ICD-10-CM | POA: Diagnosis not present

## 2021-09-19 DIAGNOSIS — R7301 Impaired fasting glucose: Secondary | ICD-10-CM | POA: Diagnosis not present

## 2021-09-19 DIAGNOSIS — R945 Abnormal results of liver function studies: Secondary | ICD-10-CM | POA: Diagnosis not present

## 2021-09-23 DIAGNOSIS — J449 Chronic obstructive pulmonary disease, unspecified: Secondary | ICD-10-CM | POA: Diagnosis not present

## 2021-09-23 DIAGNOSIS — E559 Vitamin D deficiency, unspecified: Secondary | ICD-10-CM | POA: Diagnosis not present

## 2021-09-23 DIAGNOSIS — I10 Essential (primary) hypertension: Secondary | ICD-10-CM | POA: Diagnosis not present

## 2021-09-23 DIAGNOSIS — R69 Illness, unspecified: Secondary | ICD-10-CM | POA: Diagnosis not present

## 2021-09-23 DIAGNOSIS — R197 Diarrhea, unspecified: Secondary | ICD-10-CM | POA: Diagnosis not present

## 2021-09-23 DIAGNOSIS — G894 Chronic pain syndrome: Secondary | ICD-10-CM | POA: Diagnosis not present

## 2021-09-23 DIAGNOSIS — R7301 Impaired fasting glucose: Secondary | ICD-10-CM | POA: Diagnosis not present

## 2021-09-23 DIAGNOSIS — G609 Hereditary and idiopathic neuropathy, unspecified: Secondary | ICD-10-CM | POA: Diagnosis not present

## 2021-09-23 DIAGNOSIS — K219 Gastro-esophageal reflux disease without esophagitis: Secondary | ICD-10-CM | POA: Diagnosis not present

## 2021-10-15 DIAGNOSIS — E782 Mixed hyperlipidemia: Secondary | ICD-10-CM | POA: Diagnosis not present

## 2021-10-15 DIAGNOSIS — I10 Essential (primary) hypertension: Secondary | ICD-10-CM | POA: Diagnosis not present

## 2021-10-26 DIAGNOSIS — U071 COVID-19: Secondary | ICD-10-CM | POA: Diagnosis not present

## 2021-11-13 ENCOUNTER — Other Ambulatory Visit: Payer: Self-pay

## 2021-11-13 ENCOUNTER — Encounter: Payer: Self-pay | Admitting: *Deleted

## 2021-11-13 ENCOUNTER — Encounter: Payer: Self-pay | Admitting: Internal Medicine

## 2021-11-13 ENCOUNTER — Ambulatory Visit (INDEPENDENT_AMBULATORY_CARE_PROVIDER_SITE_OTHER): Payer: Medicare HMO | Admitting: Internal Medicine

## 2021-11-13 VITALS — BP 110/66 | HR 75 | Temp 97.3°F | Ht 65.0 in | Wt 148.4 lb

## 2021-11-13 DIAGNOSIS — R197 Diarrhea, unspecified: Secondary | ICD-10-CM | POA: Diagnosis not present

## 2021-11-13 DIAGNOSIS — K219 Gastro-esophageal reflux disease without esophagitis: Secondary | ICD-10-CM

## 2021-11-13 DIAGNOSIS — D126 Benign neoplasm of colon, unspecified: Secondary | ICD-10-CM | POA: Diagnosis not present

## 2021-11-13 DIAGNOSIS — Z8619 Personal history of other infectious and parasitic diseases: Secondary | ICD-10-CM | POA: Diagnosis not present

## 2021-11-13 MED ORDER — PEG 3350-KCL-NA BICARB-NACL 420 G PO SOLR: ORAL | 0 refills | Status: DC
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
## 2021-11-13 NOTE — Addendum Note (Signed)
Addended by: Cheron Every on: 11/13/2021 12:07 PM ? ? Modules accepted: Orders ? ?

## 2021-11-14 ENCOUNTER — Encounter: Payer: Self-pay | Admitting: *Deleted

## 2021-12-05 ENCOUNTER — Other Ambulatory Visit: Payer: Self-pay

## 2021-12-05 ENCOUNTER — Encounter (HOSPITAL_COMMUNITY): Payer: Self-pay

## 2021-12-05 ENCOUNTER — Encounter (HOSPITAL_COMMUNITY)
Admission: RE | Admit: 2021-12-05 | Discharge: 2021-12-05 | Disposition: A | Discharge status: Home / Self Care | Payer: Medicare HMO | Source: Ambulatory Visit | Attending: Internal Medicine | Admitting: Internal Medicine

## 2021-12-05 DIAGNOSIS — Z01810 Encounter for preprocedural cardiovascular examination: Secondary | ICD-10-CM | POA: Insufficient documentation

## 2021-12-05 HISTORY — DX: Age-related osteoporosis without current pathological fracture: M81.0

## 2021-12-05 HISTORY — DX: Unspecified osteoarthritis, unspecified site: M19.90

## 2021-12-05 HISTORY — DX: Scoliosis, unspecified: M41.9

## 2021-12-05 HISTORY — DX: Chronic obstructive pulmonary disease, unspecified: J44.9

## 2021-12-05 NOTE — Patient Instructions (Signed)
? ? ? ? ? ? Michael Bass. ? 12/05/2021  ?  ? '@PREFPERIOPPHARMACY'$ @ ? ? Your procedure is scheduled on  12/09/2021. ? ? Report to [facility] at  1130  A.M. ? ? Call this number if you have problems the morning of surgery: ? [phone] ? ? Remember: ? Follow the diet and prep instructions given to you by the office. ? ?  Use your inhalers before you come and bring your rescue inhaler with you. ?  ? Take these medicines the morning of surgery with A SIP OF WATER  ? ?         amlodipine, clonazepam, flexeril, gabapentin, mobic or oxycodone(If needed), protnix. ?  ? Do not wear jewelry, make-up or nail polish. ? Do not wear lotions, powders, or perfumes, or deodorant. ? Do not shave 48 hours prior to surgery.  Men may shave face and neck. ? Do not bring valuables to the hospital. ? [facility] is not responsible for any belongings or valuables. ? ?Contacts, dentures or bridgework may not be worn into surgery.  Leave your suitcase in the car.  After surgery it may be brought to your room. ? ?For patients admitted to the hospital, discharge time will be determined by your treatment team. ? ?Patients discharged the day of surgery will not be allowed to drive home and must have someone with them for 24 hours.  ? ? ?Special instructions:   DO NOT smoke tobacco or vape for 24 hours before your procedure. ? ?Please read over the following fact sheets that you were given. ?Anesthesia Post-op Instructions and Care and Recovery After Surgery ?  ? ? ? Colonoscopy, Adult, Care After ?The following information offers guidance on how to care for yourself after your procedure. Your health care provider may also give you more specific instructions. If you have problems or questions, contact your health care provider. ?What can I expect after the procedure? ?After the procedure, it is common to have: ?A small amount of blood in your stool for 24 hours after the procedure. ?Some gas. ?Mild cramping or bloating of your  abdomen. ?Follow these instructions at home: ?Eating and drinking ? ?Drink enough fluid to keep your urine pale yellow. ?Follow instructions from your health care provider about eating or drinking restrictions. ?Resume your normal diet as told by your health care provider. Avoid heavy or fried foods that are hard to digest. ?Activity ?Rest as told by your health care provider. ?Avoid sitting for a long time without moving. Get up to take short walks every 1-2 hours. This is important to improve blood flow and breathing. Ask for help if you feel weak or unsteady. ?Return to your normal activities as told by your health care provider. Ask your health care provider what activities are safe for you. ?Managing cramping and bloating ? ?Try walking around when you have cramps or feel bloated. ?If directed, apply heat to your abdomen as told by your health care provider. Use the heat source that your health care provider recommends, such as a moist heat pack or a heating pad. ?Place a towel between your skin and the heat source. ?Leave the heat on for 20-30 minutes. ?Remove the heat if your skin turns bright red. This is especially important if you are unable to feel pain, heat, or cold. You have a greater risk of getting burned. ?General instructions ?If you were given a sedative during the procedure, it can affect you for several hours. Do  not drive or operate machinery until your health care provider says that it is safe. ?For the first 24 hours after the procedure: ?Do not sign important documents. ?Do not drink alcohol. ?Do your regular daily activities at a slower pace than normal. ?Eat soft foods that are easy to digest. ?Take over-the-counter and prescription medicines only as told by your health care provider. ?Keep all follow-up visits. This is important. ?Contact a health care provider if: ?You have blood in your stool 2-3 days after the procedure. ?Get help right away if: ?You have more than a small spotting of  blood in your stool. ?You have large blood clots in your stool. ?You have swelling of your abdomen. ?You have nausea or vomiting. ?You have a fever. ?You have increasing pain in your abdomen that is not relieved with medicine. ?These symptoms may be an emergency. Get help right away. Call 911. ?Do not wait to see if the symptoms will go away. ?Do not drive yourself to the hospital. ?Summary ?After the procedure, it is common to have a small amount of blood in your stool. You may also have mild cramping and bloating of your abdomen. ?If you were given a sedative during the procedure, it can affect you for several hours. Do not drive or operate machinery until your health care provider says that it is safe. ?Get help right away if you have a lot of blood in your stool, nausea or vomiting, a fever, or increased pain in your abdomen. ?This information is not intended to replace advice given to you by your health care provider. Make sure you discuss any questions you have with your health care provider. ?Document Revised: 03/27/2021 Document Reviewed: 03/27/2021 ?Elsevier Patient Education ? [place]. ?Monitored Anesthesia Care, Care After ?This sheet gives you information about how to care for yourself after your procedure. Your health care provider may also give you more specific instructions. If you have problems or questions, contact your health care provider. ?What can I expect after the procedure? ?After the procedure, it is common to have: ?Tiredness. ?Forgetfulness about what happened after the procedure. ?Impaired judgment for important decisions. ?Nausea or vomiting. ?Some difficulty with balance. ?Follow these instructions at home: ?For the time period you were told by your health care provider: ? ?  ? ?Rest as needed. ?Do not participate in activities where you could fall or become injured. ?Do not drive or use machinery. ?Do not drink alcohol. ?Do not take sleeping pills or medicines that cause  drowsiness. ?Do not make important decisions or sign legal documents. ?Do not take care of children on your own. ?Eating and drinking ?Follow the diet that is recommended by your health care provider. ?Drink enough fluid to keep your urine pale yellow. ?If you vomit: ?Drink water, juice, or soup when you can drink without vomiting. ?Make sure you have little or no nausea before eating solid foods. ?General instructions ?Have a responsible adult stay with you for the time you are told. It is important to have someone help care for you until you are awake and alert. ?Take over-the-counter and prescription medicines only as told by your health care provider. ?If you have sleep apnea, surgery and certain medicines can increase your risk for breathing problems. Follow instructions from your health care provider about wearing your sleep device: ?Anytime you are sleeping, including during daytime naps. ?While taking prescription pain medicines, sleeping medicines, or medicines that make you drowsy. ?Avoid smoking. ?Keep all follow-up visits as told  by your health care provider. This is important. ?Contact a health care provider if: ?You keep feeling nauseous or you keep vomiting. ?You feel light-headed. ?You are still sleepy or having trouble with balance after 24 hours. ?You develop a rash. ?You have a fever. ?You have redness or swelling around the IV site. ?Get help right away if: ?You have trouble breathing. ?You have new-onset confusion at home. ?Summary ?For several hours after your procedure, you may feel tired. You may also be forgetful and have poor judgment. ?Have a responsible adult stay with you for the time you are told. It is important to have someone help care for you until you are awake and alert. ?Rest as told. Do not drive or operate machinery. Do not drink alcohol or take sleeping pills. ?Get help right away if you have trouble breathing, or if you suddenly become confused. ?This information is not  intended to replace advice given to you by your health care provider. Make sure you discuss any questions you have with your health care provider. ?Document Revised: 07/09/2021 Document Reviewed: 07/07/2019 ?Elsevier Patient

## 2021-12-09 ENCOUNTER — Ambulatory Visit (HOSPITAL_BASED_OUTPATIENT_CLINIC_OR_DEPARTMENT_OTHER): Payer: Medicare HMO | Admitting: Anesthesiology

## 2021-12-09 ENCOUNTER — Encounter (HOSPITAL_COMMUNITY)
Admission: RE | Disposition: A | Discharge status: Home / Self Care | Payer: Self-pay | Source: Home / Self Care | Attending: Internal Medicine

## 2021-12-09 ENCOUNTER — Encounter (HOSPITAL_COMMUNITY): Payer: Self-pay

## 2021-12-09 ENCOUNTER — Ambulatory Visit (HOSPITAL_COMMUNITY)
Admission: RE | Admit: 2021-12-09 | Discharge: 2021-12-09 | Disposition: A | Discharge status: Home / Self Care | Payer: Medicare HMO | Attending: Internal Medicine | Admitting: Internal Medicine

## 2021-12-09 ENCOUNTER — Ambulatory Visit (HOSPITAL_COMMUNITY): Payer: Medicare HMO | Admitting: Anesthesiology

## 2021-12-09 DIAGNOSIS — J449 Chronic obstructive pulmonary disease, unspecified: Secondary | ICD-10-CM | POA: Insufficient documentation

## 2021-12-09 DIAGNOSIS — K5730 Diverticulosis of large intestine without perforation or abscess without bleeding: Secondary | ICD-10-CM

## 2021-12-09 DIAGNOSIS — F419 Anxiety disorder, unspecified: Secondary | ICD-10-CM | POA: Insufficient documentation

## 2021-12-09 DIAGNOSIS — R197 Diarrhea, unspecified: Secondary | ICD-10-CM | POA: Diagnosis not present

## 2021-12-09 DIAGNOSIS — K648 Other hemorrhoids: Secondary | ICD-10-CM

## 2021-12-09 DIAGNOSIS — F17210 Nicotine dependence, cigarettes, uncomplicated: Secondary | ICD-10-CM | POA: Insufficient documentation

## 2021-12-09 DIAGNOSIS — M1990 Unspecified osteoarthritis, unspecified site: Secondary | ICD-10-CM | POA: Diagnosis not present

## 2021-12-09 DIAGNOSIS — I10 Essential (primary) hypertension: Secondary | ICD-10-CM

## 2021-12-09 DIAGNOSIS — R69 Illness, unspecified: Secondary | ICD-10-CM | POA: Diagnosis not present

## 2021-12-09 DIAGNOSIS — Z79899 Other long term (current) drug therapy: Secondary | ICD-10-CM | POA: Insufficient documentation

## 2021-12-09 DIAGNOSIS — Z1211 Encounter for screening for malignant neoplasm of colon: Secondary | ICD-10-CM | POA: Diagnosis not present

## 2021-12-09 DIAGNOSIS — Z86010 Personal history of colonic polyps: Secondary | ICD-10-CM | POA: Diagnosis not present

## 2021-12-09 DIAGNOSIS — K219 Gastro-esophageal reflux disease without esophagitis: Secondary | ICD-10-CM | POA: Insufficient documentation

## 2021-12-09 HISTORY — PX: BIOPSY: SHX5522

## 2021-12-09 HISTORY — PX: COLONOSCOPY WITH PROPOFOL: SHX5780

## 2021-12-09 SURGERY — COLONOSCOPY WITH PROPOFOL
Anesthesia: General

## 2021-12-09 MED ORDER — PROPOFOL 10 MG/ML IV BOLUS
INTRAVENOUS | Status: DC | PRN
  Administered 2021-12-09: 50 mg via INTRAVENOUS
  Administered 2021-12-09: 30 mg via INTRAVENOUS
  Administered 2021-12-09: 40 mg via INTRAVENOUS
  Administered 2021-12-09: 100 mg via INTRAVENOUS

## 2021-12-09 MED ORDER — LIDOCAINE HCL (CARDIAC) PF 100 MG/5ML IV SOSY
PREFILLED_SYRINGE | INTRAVENOUS | Status: DC | PRN
  Administered 2021-12-09: 50 mg via INTRAVENOUS

## 2021-12-09 MED ORDER — LACTATED RINGERS IV SOLN: INTRAVENOUS | Status: DC

## 2021-12-09 NOTE — Transfer of Care (Signed)
Immediate Anesthesia Transfer of Care Note ? ?Patient: Michael Bass. ? ?Procedure(s) Performed: COLONOSCOPY WITH PROPOFOL ?BIOPSY ? ?Patient Location: Short Stay ? ?Anesthesia Type:General ? ?Level of Consciousness: awake, alert  and oriented ? ?Airway & Oxygen Therapy: Patient Spontanous Breathing ? ?Post-op Assessment: Report given to RN and Post -op Vital signs reviewed and stable ? ?Post vital signs: Reviewed and stable ? ?Last Vitals:  ?Vitals Value Taken Time  ?BP    ?Temp    ?Pulse    ?Resp    ?SpO2    ? ? ?Last Pain:  ?Vitals:  ? 12/09/21 1223  ?TempSrc:   ?PainSc: 0-No pain  ?   ? ?Patients Stated Pain Goal: 5 (12/09/21 1140) ? ?Complications: No notable events documented. ?

## 2021-12-09 NOTE — Anesthesia Preprocedure Evaluation (Signed)
Anesthesia Evaluation  ?Patient identified by MRN, date of birth, ID band ?Patient awake ? ? ? ?Reviewed: ?Allergy & Precautions, NPO status , Patient's Chart, lab work & pertinent test results ? ?Airway ?Mallampati: II ? ?TM Distance: >3 FB ?Neck ROM: Full ? ? ? Dental ? ?(+) Edentulous Upper, Edentulous Lower ?  ?Pulmonary ?shortness of breath and with exertion, COPD,  COPD inhaler, Current Smoker,  ?  ?Pulmonary exam normal ?breath sounds clear to auscultation ? ? ? ? ? ? Cardiovascular ?Exercise Tolerance: Good ?hypertension, Pt. on medications ?Normal cardiovascular exam ?Rhythm:Regular Rate:Normal ? ? ?  ?Neuro/Psych ?PSYCHIATRIC DISORDERS Anxiety negative neurological ROS ?   ? GI/Hepatic ?GERD  Medicated and Controlled,(+) Hepatitis -, C  ?Endo/Other  ?negative endocrine ROS ? Renal/GU ?negative Renal ROS  ?negative genitourinary ?  ?Musculoskeletal ? ?(+) Arthritis , Osteoarthritis,   ? Abdominal ?  ?Peds ?negative pediatric ROS ?(+)  Hematology ?negative hematology ROS ?(+)   ?Anesthesia Other Findings ? ? Reproductive/Obstetrics ?negative OB ROS ? ?  ? ? ? ? ? ? ? ? ? ? ? ? ? ?  ?  ? ? ? ? ? ? ? ?Anesthesia Physical ?Anesthesia Plan ? ?ASA: 3 ? ?Anesthesia Plan: General  ? ?Post-op Pain Management: Minimal or no pain anticipated  ? ?Induction: Intravenous ? ?PONV Risk Score and Plan: Propofol infusion ? ?Airway Management Planned: Nasal Cannula and Natural Airway ? ?Additional Equipment:  ? ?Intra-op Plan:  ? ?Post-operative Plan:  ? ?Informed Consent: I have reviewed the patients History and Physical, chart, labs and discussed the procedure including the risks, benefits and alternatives for the proposed anesthesia with the patient or authorized representative who has indicated his/her understanding and acceptance.  ? ? ? ?Dental advisory given ? ?Plan Discussed with: CRNA and Surgeon ? ?Anesthesia Plan Comments:   ? ? ? ? ? ? ?Anesthesia Quick Evaluation ? ?

## 2021-12-09 NOTE — Discharge Instructions (Addendum)
?  Colonoscopy ?Discharge Instructions ? ?Read the instructions outlined below and refer to this sheet in the next few weeks. These discharge instructions provide you with general information on caring for yourself after you leave the hospital. Your doctor may also give you specific instructions. While your treatment has been planned according to the most current medical practices available, unavoidable complications occasionally occur.  ? ?ACTIVITY ?You may resume your regular activity, but move at a slower pace for the next 24 hours.  ?Take frequent rest periods for the next 24 hours.  ?Walking will help get rid of the air and reduce the bloated feeling in your belly (abdomen).  ?No driving for 24 hours (because of the medicine (anesthesia) used during the test).   ?Do not sign any important legal documents or operate any machinery for 24 hours (because of the anesthesia used during the test).  ?NUTRITION ?Drink plenty of fluids.  ?You may resume your normal diet as instructed by your doctor.  ?Begin with a light meal and progress to your normal diet. Heavy or fried foods are harder to digest and may make you feel sick to your stomach (nauseated).  ?Avoid alcoholic beverages for 24 hours or as instructed.  ?MEDICATIONS ?You may resume your normal medications unless your doctor tells you otherwise.  ?WHAT YOU CAN EXPECT TODAY ?Some feelings of bloating in the abdomen.  ?Passage of more gas than usual.  ?Spotting of blood in your stool or on the toilet paper.  ?IF YOU HAD POLYPS REMOVED DURING THE COLONOSCOPY: ?No aspirin products for 7 days or as instructed.  ?No alcohol for 7 days or as instructed.  ?Eat a soft diet for the next 24 hours.  ?FINDING OUT THE RESULTS OF YOUR TEST ?Not all test results are available during your visit. If your test results are not back during the visit, make an appointment with your caregiver to find out the results. Do not assume everything is normal if you have not heard from your  caregiver or the medical facility. It is important for you to follow up on all of your test results.  ?SEEK IMMEDIATE MEDICAL ATTENTION IF: ?You have more than a spotting of blood in your stool.  ?Your belly is swollen (abdominal distention).  ?You are nauseated or vomiting.  ?You have a temperature over 101.  ?You have abdominal pain or discomfort that is severe or gets worse throughout the day.  ? ?Your colonoscopy was relatively unremarkable.  I did not find any polyps or evidence of colon cancer.  I recommend repeating colonoscopy in 10 years for colon cancer screening purposes.   ? ?I did take biopsies of your colon to rule out microscopic colitis as a cause of your chronic diarrhea.  ? ?You do have diverticulosis and internal hemorrhoids. I would recommend increasing fiber in your diet or adding OTC Benefiber/Metamucil. Be sure to drink at least 4 to 6 glasses of water daily. Follow-up with Dr. Abbey Chatters in 3-4 months ? ? ?I hope you have a great rest of your week! ? ?Elon Alas. Abbey Chatters, D.O. ?Gastroenterology and Hepatology ?[facility] ? ?

## 2021-12-09 NOTE — Interval H&P Note (Signed)
History and Physical Interval Note: ? ?12/09/2021 ?11:48 AM ? ?Michael Bass.  has presented today for surgery, with the diagnosis of diarrhea, hx colon polyps.  The various methods of treatment have been discussed with the patient and family. After consideration of risks, benefits and other options for treatment, the patient has consented to  Procedure(s) with comments: ?COLONOSCOPY WITH PROPOFOL (N/A) - 1:30pm as a surgical intervention.  The patient's history has been reviewed, patient examined, no change in status, stable for surgery.  I have reviewed the patient's chart and labs.  Questions were answered to the patient's satisfaction.   ? ? ?[name] ? ? ?

## 2021-12-09 NOTE — Anesthesia Postprocedure Evaluation (Signed)
Anesthesia Post Note ? ?Patient: Michael Bass. ? ?Procedure(s) Performed: COLONOSCOPY WITH PROPOFOL ?BIOPSY ? ?Patient location during evaluation: Phase II ?Anesthesia Type: General ?Level of consciousness: awake and alert and oriented ?Pain management: pain level controlled ?Vital Signs Assessment: post-procedure vital signs reviewed and stable ?Respiratory status: spontaneous breathing, nonlabored ventilation and respiratory function stable ?Cardiovascular status: blood pressure returned to baseline and stable ?Postop Assessment: no apparent nausea or vomiting ?Anesthetic complications: no ? ? ?No notable events documented. ? ? ?Last Vitals:  ?Vitals:  ? 12/09/21 1140 12/09/21 1241  ?BP: 131/72 115/76  ?Pulse: 77 79  ?Resp: 15 16  ?Temp: 36.6 ?C 36.6 ?C  ?SpO2: 99% 99%  ?  ?Last Pain:  ?Vitals:  ? 12/09/21 1241  ?TempSrc: Oral  ?PainSc: 3   ? ? ?  ?  ?  ?  ?  ?  ? ?Delissa Silba C Maigan Bittinger ? ? ? ? ?

## 2021-12-09 NOTE — Op Note (Signed)
[facility] ?Patient Name: Michael Bass ?Procedure Date: 12/09/2021 12:15 PM ?MRN: [id number] ?Date of Birth: [birth year] ?Attending MD: Elon Alas. Abbey Chatters , DO ?CSN: [id number] ?Age: 66 ?Admit Type: Outpatient ?Procedure:                Colonoscopy ?Indications:              Screening for colorectal malignant neoplasm ?Providers:                Elon Alas. Abbey Chatters, DO, Tammy Vaught, RN, Nelma Rothman,  ?                          Technician ?Referring MD:              ?Medicines:                See the Anesthesia note for documentation of the  ?                          administered medications ?Complications:            No immediate complications. ?Estimated Blood Loss:     Estimated blood loss was minimal. ?Procedure:                Pre-Anesthesia Assessment: ?                          - The anesthesia plan was to use monitored  ?                          anesthesia care (MAC). ?                          After obtaining informed consent, the colonoscope  ?                          was passed under direct vision. Throughout the  ?                          procedure, the patient's blood pressure, pulse, and  ?                          oxygen saturations were monitored continuously. The  ?                          PCF-HQ190L ([id number]) scope was introduced through  ?                          the anus and advanced to the the cecum, identified  ?                          by appendiceal orifice and ileocecal valve. The  ?                          colonoscopy was performed without difficulty. The  ?                          patient tolerated the procedure well. The  quality  ?                          of the bowel preparation was evaluated using the  ?                          BBPS [place] Bowel Preparation Scale) with scores  ?                          of: Right Colon = 3, Transverse Colon = 3 and Left  ?                          Colon = 3 (entire mucosa seen well with no residual  ?                          staining, small  fragments of stool or opaque  ?                          liquid). The total BBPS score equals 9. ?Scope In: 12:28:28 PM ?Scope Out: 12:37:42 PM ?Scope Withdrawal Time: 0 hours 6 minutes 35 seconds  ?Total Procedure Duration: 0 hours 9 minutes 14 seconds  ?Findings: ?     The perianal and digital rectal examinations were normal. ?     Non-bleeding internal hemorrhoids were found during endoscopy. ?     A few medium-mouthed diverticula were found in the sigmoid colon. ?     Biopsies for histology were taken with a cold forceps from the ascending  ?     colon, transverse colon and descending colon for evaluation of  ?     microscopic colitis. ?     The terminal ileum appeared normal. ?Impression:               - Non-bleeding internal hemorrhoids. ?                          - Diverticulosis in the sigmoid colon. ?                          - The examined portion of the ileum was normal. ?                          - Biopsies were taken with a cold forceps from the  ?                          ascending colon, transverse colon and descending  ?                          colon for evaluation of microscopic colitis. ?Moderate Sedation: ?     Per Anesthesia Care ?Recommendation:           - Patient has a contact number available for  ?                          emergencies. The signs and symptoms of potential  ?  delayed complications were discussed with the  ?                          patient. Return to normal activities tomorrow.  ?                          Written discharge instructions were provided to the  ?                          patient. ?                          - Resume previous diet. ?                          - Continue present medications. ?                          - Await pathology results. ?                          - Repeat colonoscopy in 10 years for screening  ?                          purposes. ?                          - Return to GI clinic in 4 months. ?Procedure Code(s):         --- Professional --- ?                          [phone], Colonoscopy, flexible; with biopsy, single  ?                          or multiple ?Diagnosis Code(s):        --- Professional --- ?                          Z12.11, Encounter for screening for malignant  ?                          neoplasm of colon ?                          K64.8, Other hemorrhoids ?                          K57.30, Diverticulosis of large intestine without  ?                          perforation or abscess without bleeding ?CPT copyright 2019 American Medical Association. All rights reserved. ?The codes documented in this report are preliminary and upon coder review may  ?be revised to meet current compliance requirements. ?Elon Alas. Abbey Chatters, DO ?Elon Alas. Abbey Chatters, DO ?12/09/2021 12:40:32 PM ?This report has been signed electronically. ?Number of Addenda: 0 ?
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
## 2021-12-11 LAB — SURGICAL PATHOLOGY

## 2021-12-12 ENCOUNTER — Encounter (HOSPITAL_COMMUNITY): Payer: Self-pay | Admitting: Internal Medicine

## 2022-01-03 DIAGNOSIS — G609 Hereditary and idiopathic neuropathy, unspecified: Secondary | ICD-10-CM | POA: Diagnosis not present

## 2022-01-03 DIAGNOSIS — R7301 Impaired fasting glucose: Secondary | ICD-10-CM | POA: Diagnosis not present

## 2022-01-03 DIAGNOSIS — J449 Chronic obstructive pulmonary disease, unspecified: Secondary | ICD-10-CM | POA: Diagnosis not present

## 2022-01-03 DIAGNOSIS — R69 Illness, unspecified: Secondary | ICD-10-CM | POA: Diagnosis not present

## 2022-01-03 DIAGNOSIS — G894 Chronic pain syndrome: Secondary | ICD-10-CM | POA: Diagnosis not present

## 2022-01-03 DIAGNOSIS — J302 Other seasonal allergic rhinitis: Secondary | ICD-10-CM | POA: Diagnosis not present

## 2022-01-03 DIAGNOSIS — E559 Vitamin D deficiency, unspecified: Secondary | ICD-10-CM | POA: Diagnosis not present

## 2022-01-03 DIAGNOSIS — I10 Essential (primary) hypertension: Secondary | ICD-10-CM | POA: Diagnosis not present

## 2022-01-03 DIAGNOSIS — R197 Diarrhea, unspecified: Secondary | ICD-10-CM | POA: Diagnosis not present

## 2022-01-03 DIAGNOSIS — K219 Gastro-esophageal reflux disease without esophagitis: Secondary | ICD-10-CM | POA: Diagnosis not present

## 2022-01-03 DIAGNOSIS — M818 Other osteoporosis without current pathological fracture: Secondary | ICD-10-CM | POA: Diagnosis not present

## 2022-03-17 ENCOUNTER — Ambulatory Visit: Payer: Medicare HMO | Admitting: Gastroenterology

## 2022-04-08 DIAGNOSIS — E559 Vitamin D deficiency, unspecified: Secondary | ICD-10-CM | POA: Diagnosis not present

## 2022-04-08 DIAGNOSIS — R7301 Impaired fasting glucose: Secondary | ICD-10-CM | POA: Diagnosis not present

## 2022-04-08 DIAGNOSIS — Z13220 Encounter for screening for lipoid disorders: Secondary | ICD-10-CM | POA: Diagnosis not present

## 2022-04-15 DIAGNOSIS — I10 Essential (primary) hypertension: Secondary | ICD-10-CM | POA: Diagnosis not present

## 2022-04-15 DIAGNOSIS — R7301 Impaired fasting glucose: Secondary | ICD-10-CM | POA: Diagnosis not present

## 2022-04-15 DIAGNOSIS — E559 Vitamin D deficiency, unspecified: Secondary | ICD-10-CM | POA: Diagnosis not present

## 2022-04-15 DIAGNOSIS — G609 Hereditary and idiopathic neuropathy, unspecified: Secondary | ICD-10-CM | POA: Diagnosis not present

## 2022-04-15 DIAGNOSIS — J449 Chronic obstructive pulmonary disease, unspecified: Secondary | ICD-10-CM | POA: Diagnosis not present

## 2022-04-15 DIAGNOSIS — R69 Illness, unspecified: Secondary | ICD-10-CM | POA: Diagnosis not present

## 2022-04-15 DIAGNOSIS — J302 Other seasonal allergic rhinitis: Secondary | ICD-10-CM | POA: Diagnosis not present

## 2022-04-15 DIAGNOSIS — G894 Chronic pain syndrome: Secondary | ICD-10-CM | POA: Diagnosis not present

## 2022-04-15 DIAGNOSIS — K219 Gastro-esophageal reflux disease without esophagitis: Secondary | ICD-10-CM | POA: Diagnosis not present

## 2022-04-15 DIAGNOSIS — M818 Other osteoporosis without current pathological fracture: Secondary | ICD-10-CM | POA: Diagnosis not present

## 2022-04-15 DIAGNOSIS — R197 Diarrhea, unspecified: Secondary | ICD-10-CM | POA: Diagnosis not present

## 2022-05-05 DIAGNOSIS — H52223 Regular astigmatism, bilateral: Secondary | ICD-10-CM | POA: Diagnosis not present

## 2022-05-05 DIAGNOSIS — H524 Presbyopia: Secondary | ICD-10-CM | POA: Diagnosis not present

## 2022-05-05 DIAGNOSIS — H2513 Age-related nuclear cataract, bilateral: Secondary | ICD-10-CM | POA: Diagnosis not present

## 2022-05-05 DIAGNOSIS — H5203 Hypermetropia, bilateral: Secondary | ICD-10-CM | POA: Diagnosis not present

## 2022-06-07 IMAGING — DX DG CERVICAL SPINE COMPLETE 4+V
5 series · 6 of 6 positions shown · non-contrast
Comparison: None.
COMPARISON: None.

Addendum:
CLINICAL DATA: Pain pain following fall

EXAM:
CERVICAL SPINE - COMPLETE 4+ VIEW

[Series 1: c-spine lat · 0.14mm/px · 2 of 2 slices shown]
[im 1/2]
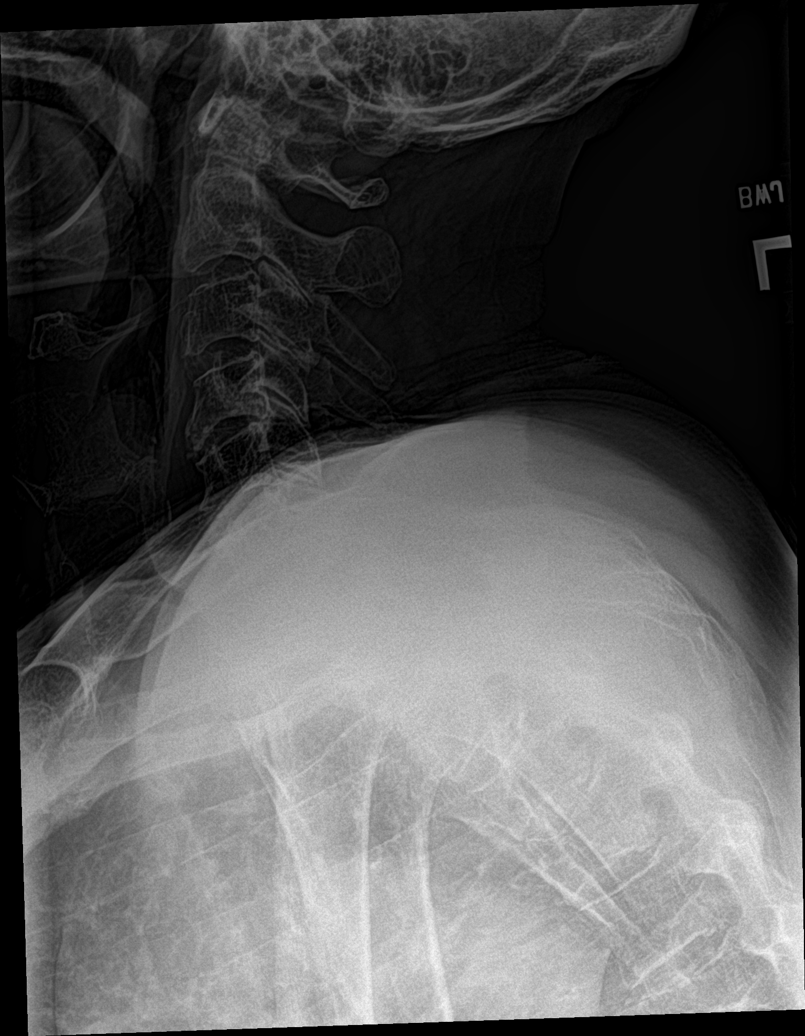
[im 2/2]
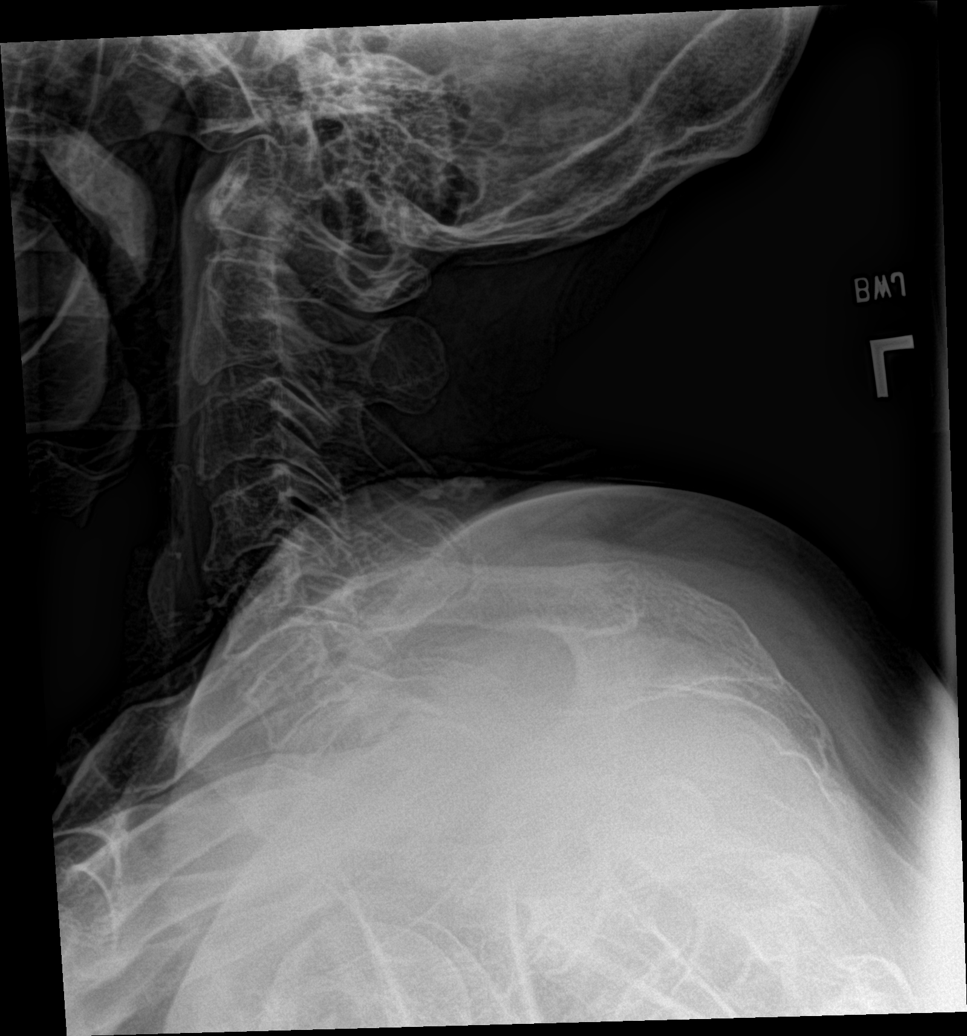

[c-spine obl (1 of 2)]
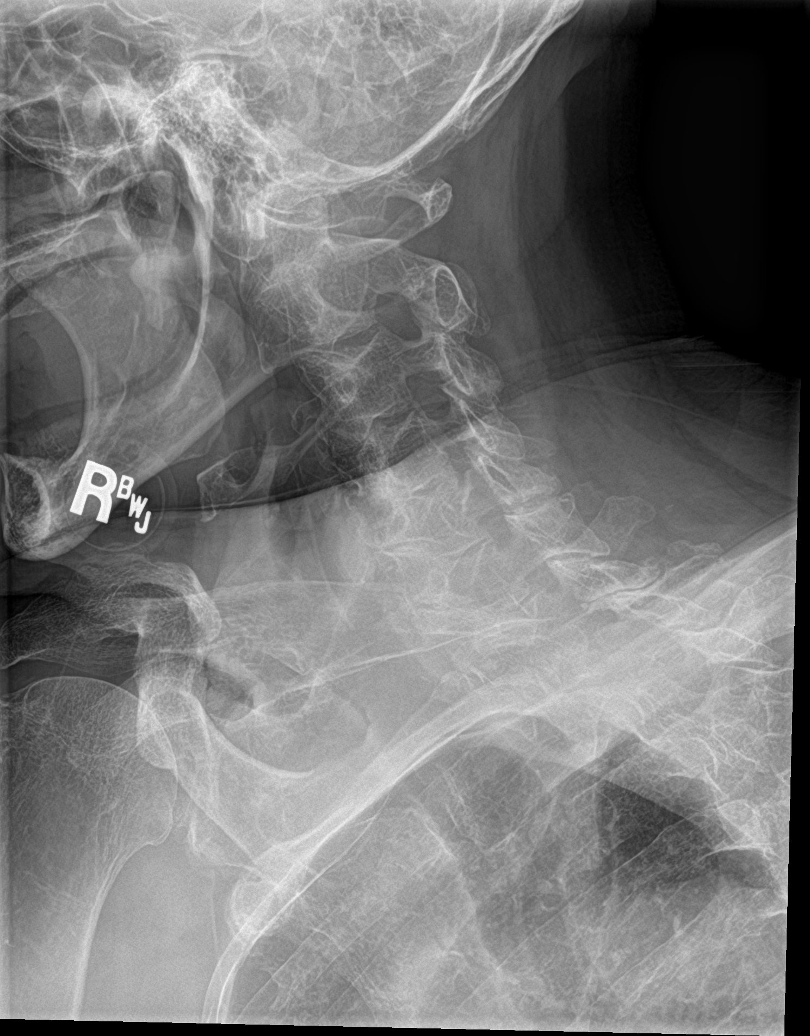

[c-spine obl (2 of 2)]
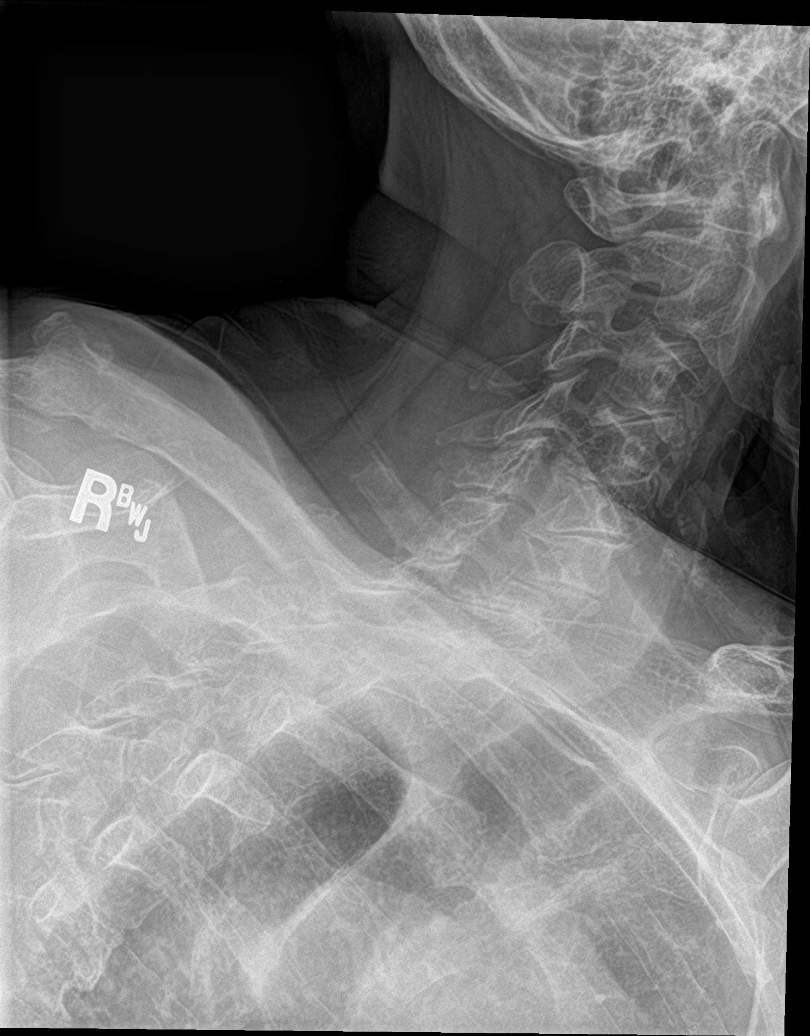

[c-spine ap]
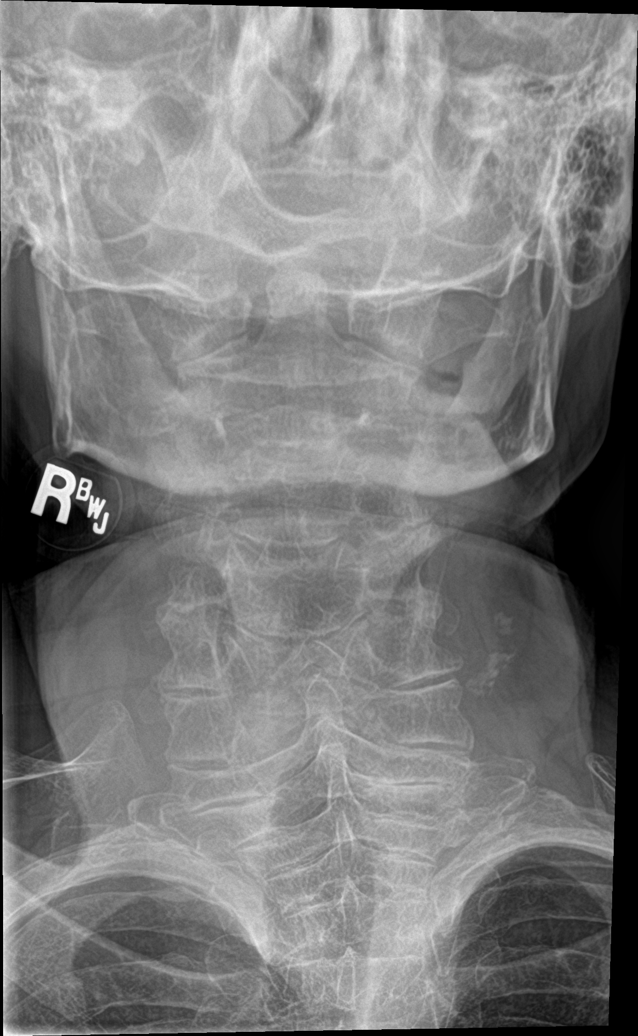

[c-spine open mouth]
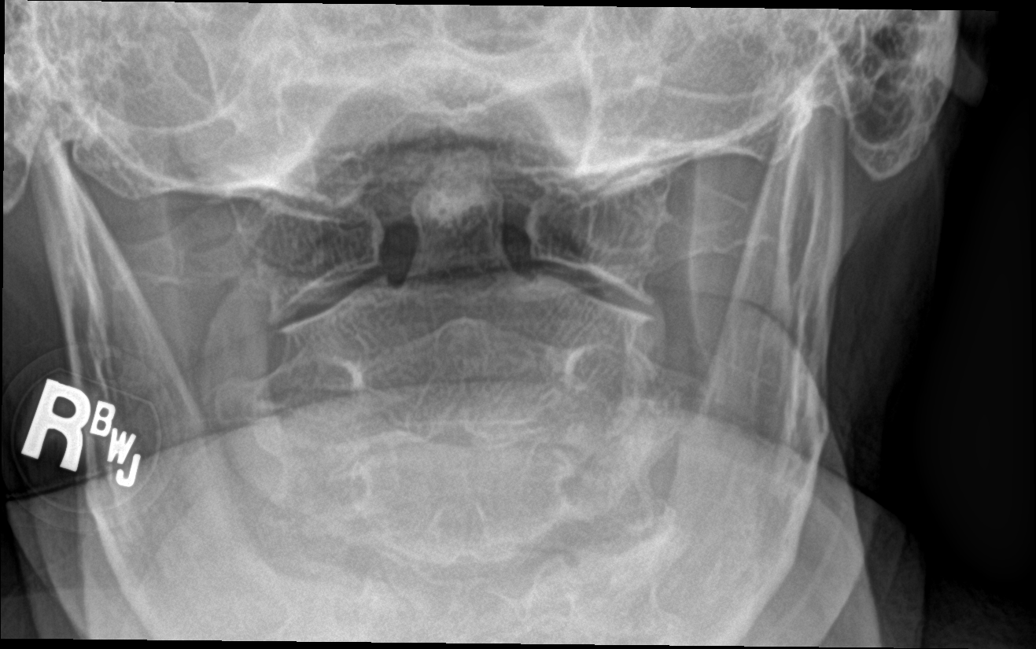

[6 of 6 positions shown; findings below may reference images not displayed]

FINDINGS: Frontal, lateral, open-mouth odontoid, and bilateral oblique views
were obtained. There is no appreciable fracture or
spondylolisthesis. Prevertebral soft tissues and predental space
regions are normal. There is no appreciable disc space narrowing. No
appreciable exit foraminal narrowing on the oblique views. Lung
apices are clear. There is calcification in the left carotid artery.
IMPRESSION: No appreciable fracture or spondylolisthesis. No appreciable
arthropathy. Calcification noted in left carotid artery.

ADDENDUM:
Comment: Study compared to prior study August 30, 2018.

*** End of Addendum ***
FINDINGS: Frontal, lateral, open-mouth odontoid, and bilateral oblique views
were obtained. There is no appreciable fracture or
spondylolisthesis. Prevertebral soft tissues and predental space
regions are normal. There is no appreciable disc space narrowing. No
appreciable exit foraminal narrowing on the oblique views. Lung
apices are clear. There is calcification in the left carotid artery.
IMPRESSION: No appreciable fracture or spondylolisthesis. No appreciable
arthropathy. Calcification noted in left carotid artery.

## 2022-06-26 DIAGNOSIS — H524 Presbyopia: Secondary | ICD-10-CM | POA: Diagnosis not present

## 2022-06-26 DIAGNOSIS — H52223 Regular astigmatism, bilateral: Secondary | ICD-10-CM | POA: Diagnosis not present

## 2022-07-16 DIAGNOSIS — I700 Atherosclerosis of aorta: Secondary | ICD-10-CM | POA: Diagnosis not present

## 2022-07-16 DIAGNOSIS — R69 Illness, unspecified: Secondary | ICD-10-CM | POA: Diagnosis not present

## 2022-07-16 DIAGNOSIS — I10 Essential (primary) hypertension: Secondary | ICD-10-CM | POA: Diagnosis not present

## 2022-07-16 DIAGNOSIS — J302 Other seasonal allergic rhinitis: Secondary | ICD-10-CM | POA: Diagnosis not present

## 2022-07-16 DIAGNOSIS — J019 Acute sinusitis, unspecified: Secondary | ICD-10-CM | POA: Diagnosis not present

## 2022-07-16 DIAGNOSIS — R197 Diarrhea, unspecified: Secondary | ICD-10-CM | POA: Diagnosis not present

## 2022-07-16 DIAGNOSIS — J449 Chronic obstructive pulmonary disease, unspecified: Secondary | ICD-10-CM | POA: Diagnosis not present

## 2022-07-16 DIAGNOSIS — G894 Chronic pain syndrome: Secondary | ICD-10-CM | POA: Diagnosis not present

## 2022-10-10 ENCOUNTER — Other Ambulatory Visit (HOSPITAL_COMMUNITY): Payer: Self-pay | Admitting: Family Medicine

## 2022-10-10 ENCOUNTER — Ambulatory Visit (HOSPITAL_COMMUNITY)
Admission: RE | Admit: 2022-10-10 | Discharge: 2022-10-10 | Disposition: A | Discharge status: Home / Self Care | Payer: Medicare HMO | Source: Ambulatory Visit | Attending: Family Medicine | Admitting: Family Medicine

## 2022-10-10 DIAGNOSIS — S42002A Fracture of unspecified part of left clavicle, initial encounter for closed fracture: Secondary | ICD-10-CM | POA: Diagnosis not present

## 2022-10-10 DIAGNOSIS — M19012 Primary osteoarthritis, left shoulder: Secondary | ICD-10-CM | POA: Diagnosis not present

## 2022-10-10 DIAGNOSIS — M545 Low back pain, unspecified: Secondary | ICD-10-CM | POA: Insufficient documentation

## 2022-10-10 DIAGNOSIS — M25512 Pain in left shoulder: Secondary | ICD-10-CM | POA: Insufficient documentation

## 2022-10-10 DIAGNOSIS — M25519 Pain in unspecified shoulder: Secondary | ICD-10-CM

## 2022-10-10 DIAGNOSIS — R7301 Impaired fasting glucose: Secondary | ICD-10-CM | POA: Diagnosis not present

## 2022-10-10 DIAGNOSIS — S2242XA Multiple fractures of ribs, left side, initial encounter for closed fracture: Secondary | ICD-10-CM | POA: Diagnosis not present

## 2022-10-10 DIAGNOSIS — E559 Vitamin D deficiency, unspecified: Secondary | ICD-10-CM | POA: Diagnosis not present

## 2022-10-16 ENCOUNTER — Other Ambulatory Visit (HOSPITAL_COMMUNITY): Payer: Self-pay | Admitting: Internal Medicine

## 2022-10-16 DIAGNOSIS — K219 Gastro-esophageal reflux disease without esophagitis: Secondary | ICD-10-CM | POA: Diagnosis not present

## 2022-10-16 DIAGNOSIS — E559 Vitamin D deficiency, unspecified: Secondary | ICD-10-CM | POA: Diagnosis not present

## 2022-10-16 DIAGNOSIS — G609 Hereditary and idiopathic neuropathy, unspecified: Secondary | ICD-10-CM | POA: Diagnosis not present

## 2022-10-16 DIAGNOSIS — M818 Other osteoporosis without current pathological fracture: Secondary | ICD-10-CM

## 2022-10-16 DIAGNOSIS — J449 Chronic obstructive pulmonary disease, unspecified: Secondary | ICD-10-CM | POA: Diagnosis not present

## 2022-10-16 DIAGNOSIS — I10 Essential (primary) hypertension: Secondary | ICD-10-CM | POA: Diagnosis not present

## 2022-10-16 DIAGNOSIS — G894 Chronic pain syndrome: Secondary | ICD-10-CM | POA: Diagnosis not present

## 2022-10-16 DIAGNOSIS — R7301 Impaired fasting glucose: Secondary | ICD-10-CM | POA: Diagnosis not present

## 2022-10-16 DIAGNOSIS — R69 Illness, unspecified: Secondary | ICD-10-CM | POA: Diagnosis not present

## 2022-10-16 DIAGNOSIS — J302 Other seasonal allergic rhinitis: Secondary | ICD-10-CM | POA: Diagnosis not present

## 2022-10-16 DIAGNOSIS — F17200 Nicotine dependence, unspecified, uncomplicated: Secondary | ICD-10-CM

## 2022-10-16 DIAGNOSIS — I700 Atherosclerosis of aorta: Secondary | ICD-10-CM | POA: Diagnosis not present

## 2022-10-22 ENCOUNTER — Ambulatory Visit (HOSPITAL_COMMUNITY)
Admission: RE | Admit: 2022-10-22 | Discharge: 2022-10-22 | Disposition: A | Discharge status: Home / Self Care | Payer: Medicare HMO | Source: Ambulatory Visit | Attending: Internal Medicine | Admitting: Internal Medicine

## 2022-10-22 DIAGNOSIS — M818 Other osteoporosis without current pathological fracture: Secondary | ICD-10-CM | POA: Diagnosis not present

## 2022-10-22 DIAGNOSIS — Z780 Asymptomatic menopausal state: Secondary | ICD-10-CM | POA: Diagnosis not present

## 2022-10-22 DIAGNOSIS — M810 Age-related osteoporosis without current pathological fracture: Secondary | ICD-10-CM | POA: Diagnosis not present

## 2022-12-26 ENCOUNTER — Ambulatory Visit (HOSPITAL_COMMUNITY)
Admission: RE | Admit: 2022-12-26 | Discharge: 2022-12-26 | Disposition: A | Discharge status: Home / Self Care | Payer: Medicare HMO | Source: Ambulatory Visit | Attending: Internal Medicine | Admitting: Internal Medicine

## 2022-12-26 DIAGNOSIS — Z122 Encounter for screening for malignant neoplasm of respiratory organs: Secondary | ICD-10-CM | POA: Diagnosis not present

## 2022-12-26 DIAGNOSIS — F17210 Nicotine dependence, cigarettes, uncomplicated: Secondary | ICD-10-CM | POA: Diagnosis not present

## 2022-12-26 DIAGNOSIS — J438 Other emphysema: Secondary | ICD-10-CM | POA: Diagnosis not present

## 2022-12-26 DIAGNOSIS — J432 Centrilobular emphysema: Secondary | ICD-10-CM | POA: Diagnosis not present

## 2022-12-26 DIAGNOSIS — F17200 Nicotine dependence, unspecified, uncomplicated: Secondary | ICD-10-CM | POA: Insufficient documentation

## 2022-12-26 DIAGNOSIS — J9811 Atelectasis: Secondary | ICD-10-CM | POA: Insufficient documentation

## 2022-12-26 DIAGNOSIS — I700 Atherosclerosis of aorta: Secondary | ICD-10-CM | POA: Insufficient documentation

## 2022-12-26 DIAGNOSIS — M4854XA Collapsed vertebra, not elsewhere classified, thoracic region, initial encounter for fracture: Secondary | ICD-10-CM | POA: Insufficient documentation

## 2023-01-14 DIAGNOSIS — F411 Generalized anxiety disorder: Secondary | ICD-10-CM | POA: Diagnosis not present

## 2023-01-14 DIAGNOSIS — J302 Other seasonal allergic rhinitis: Secondary | ICD-10-CM | POA: Diagnosis not present

## 2023-01-14 DIAGNOSIS — K219 Gastro-esophageal reflux disease without esophagitis: Secondary | ICD-10-CM | POA: Diagnosis not present

## 2023-01-14 DIAGNOSIS — Z8619 Personal history of other infectious and parasitic diseases: Secondary | ICD-10-CM | POA: Diagnosis not present

## 2023-01-14 DIAGNOSIS — F17218 Nicotine dependence, cigarettes, with other nicotine-induced disorders: Secondary | ICD-10-CM | POA: Diagnosis not present

## 2023-01-14 DIAGNOSIS — M810 Age-related osteoporosis without current pathological fracture: Secondary | ICD-10-CM | POA: Diagnosis not present

## 2023-01-14 DIAGNOSIS — G609 Hereditary and idiopathic neuropathy, unspecified: Secondary | ICD-10-CM | POA: Diagnosis not present

## 2023-01-14 DIAGNOSIS — K760 Fatty (change of) liver, not elsewhere classified: Secondary | ICD-10-CM | POA: Diagnosis not present

## 2023-01-14 DIAGNOSIS — J449 Chronic obstructive pulmonary disease, unspecified: Secondary | ICD-10-CM | POA: Diagnosis not present

## 2023-01-14 DIAGNOSIS — I700 Atherosclerosis of aorta: Secondary | ICD-10-CM | POA: Diagnosis not present

## 2023-01-14 DIAGNOSIS — G894 Chronic pain syndrome: Secondary | ICD-10-CM | POA: Diagnosis not present

## 2023-01-14 DIAGNOSIS — I10 Essential (primary) hypertension: Secondary | ICD-10-CM | POA: Diagnosis not present

## 2023-04-10 DIAGNOSIS — E559 Vitamin D deficiency, unspecified: Secondary | ICD-10-CM | POA: Diagnosis not present

## 2023-04-10 DIAGNOSIS — R7301 Impaired fasting glucose: Secondary | ICD-10-CM | POA: Diagnosis not present

## 2023-04-16 DIAGNOSIS — R7301 Impaired fasting glucose: Secondary | ICD-10-CM | POA: Diagnosis not present

## 2023-04-16 DIAGNOSIS — F411 Generalized anxiety disorder: Secondary | ICD-10-CM | POA: Diagnosis not present

## 2023-04-16 DIAGNOSIS — J302 Other seasonal allergic rhinitis: Secondary | ICD-10-CM | POA: Diagnosis not present

## 2023-04-16 DIAGNOSIS — I10 Essential (primary) hypertension: Secondary | ICD-10-CM | POA: Diagnosis not present

## 2023-04-16 DIAGNOSIS — M818 Other osteoporosis without current pathological fracture: Secondary | ICD-10-CM | POA: Diagnosis not present

## 2023-04-16 DIAGNOSIS — I2510 Atherosclerotic heart disease of native coronary artery without angina pectoris: Secondary | ICD-10-CM | POA: Diagnosis not present

## 2023-04-16 DIAGNOSIS — G894 Chronic pain syndrome: Secondary | ICD-10-CM | POA: Diagnosis not present

## 2023-04-16 DIAGNOSIS — J449 Chronic obstructive pulmonary disease, unspecified: Secondary | ICD-10-CM | POA: Diagnosis not present

## 2023-04-16 DIAGNOSIS — E559 Vitamin D deficiency, unspecified: Secondary | ICD-10-CM | POA: Diagnosis not present

## 2023-04-16 DIAGNOSIS — K219 Gastro-esophageal reflux disease without esophagitis: Secondary | ICD-10-CM | POA: Diagnosis not present

## 2023-04-16 DIAGNOSIS — F17218 Nicotine dependence, cigarettes, with other nicotine-induced disorders: Secondary | ICD-10-CM | POA: Diagnosis not present

## 2023-04-16 DIAGNOSIS — I700 Atherosclerosis of aorta: Secondary | ICD-10-CM | POA: Diagnosis not present

## 2023-06-01 DIAGNOSIS — H2513 Age-related nuclear cataract, bilateral: Secondary | ICD-10-CM | POA: Diagnosis not present

## 2023-06-01 DIAGNOSIS — H52223 Regular astigmatism, bilateral: Secondary | ICD-10-CM | POA: Diagnosis not present

## 2023-06-01 DIAGNOSIS — H5203 Hypermetropia, bilateral: Secondary | ICD-10-CM | POA: Diagnosis not present

## 2023-06-01 DIAGNOSIS — H524 Presbyopia: Secondary | ICD-10-CM | POA: Diagnosis not present

## 2023-07-14 DIAGNOSIS — F411 Generalized anxiety disorder: Secondary | ICD-10-CM | POA: Diagnosis not present

## 2023-07-14 DIAGNOSIS — I700 Atherosclerosis of aorta: Secondary | ICD-10-CM | POA: Diagnosis not present

## 2023-07-14 DIAGNOSIS — I10 Essential (primary) hypertension: Secondary | ICD-10-CM | POA: Diagnosis not present

## 2023-07-14 DIAGNOSIS — G894 Chronic pain syndrome: Secondary | ICD-10-CM | POA: Diagnosis not present

## 2023-07-14 DIAGNOSIS — F17200 Nicotine dependence, unspecified, uncomplicated: Secondary | ICD-10-CM | POA: Diagnosis not present

## 2023-07-14 DIAGNOSIS — Z79899 Other long term (current) drug therapy: Secondary | ICD-10-CM | POA: Diagnosis not present

## 2023-10-09 DIAGNOSIS — R7301 Impaired fasting glucose: Secondary | ICD-10-CM | POA: Diagnosis not present

## 2023-10-09 DIAGNOSIS — Z125 Encounter for screening for malignant neoplasm of prostate: Secondary | ICD-10-CM | POA: Diagnosis not present

## 2023-10-09 DIAGNOSIS — E559 Vitamin D deficiency, unspecified: Secondary | ICD-10-CM | POA: Diagnosis not present

## 2023-10-15 DIAGNOSIS — G609 Hereditary and idiopathic neuropathy, unspecified: Secondary | ICD-10-CM | POA: Diagnosis not present

## 2023-10-15 DIAGNOSIS — K219 Gastro-esophageal reflux disease without esophagitis: Secondary | ICD-10-CM | POA: Diagnosis not present

## 2023-10-15 DIAGNOSIS — J302 Other seasonal allergic rhinitis: Secondary | ICD-10-CM | POA: Diagnosis not present

## 2023-10-15 DIAGNOSIS — G894 Chronic pain syndrome: Secondary | ICD-10-CM | POA: Diagnosis not present

## 2023-10-15 DIAGNOSIS — M818 Other osteoporosis without current pathological fracture: Secondary | ICD-10-CM | POA: Diagnosis not present

## 2023-10-15 DIAGNOSIS — F411 Generalized anxiety disorder: Secondary | ICD-10-CM | POA: Diagnosis not present

## 2023-10-15 DIAGNOSIS — I700 Atherosclerosis of aorta: Secondary | ICD-10-CM | POA: Diagnosis not present

## 2023-10-15 DIAGNOSIS — I10 Essential (primary) hypertension: Secondary | ICD-10-CM | POA: Diagnosis not present

## 2023-10-15 DIAGNOSIS — I2510 Atherosclerotic heart disease of native coronary artery without angina pectoris: Secondary | ICD-10-CM | POA: Diagnosis not present

## 2023-10-15 DIAGNOSIS — J449 Chronic obstructive pulmonary disease, unspecified: Secondary | ICD-10-CM | POA: Diagnosis not present

## 2023-10-15 DIAGNOSIS — F17218 Nicotine dependence, cigarettes, with other nicotine-induced disorders: Secondary | ICD-10-CM | POA: Diagnosis not present

## 2023-10-15 DIAGNOSIS — R7301 Impaired fasting glucose: Secondary | ICD-10-CM | POA: Diagnosis not present

## 2024-03-10 DIAGNOSIS — F17200 Nicotine dependence, unspecified, uncomplicated: Secondary | ICD-10-CM | POA: Diagnosis not present

## 2024-03-10 DIAGNOSIS — Z79899 Other long term (current) drug therapy: Secondary | ICD-10-CM | POA: Diagnosis not present

## 2024-03-10 DIAGNOSIS — Z713 Dietary counseling and surveillance: Secondary | ICD-10-CM | POA: Diagnosis not present

## 2024-03-10 DIAGNOSIS — Z79891 Long term (current) use of opiate analgesic: Secondary | ICD-10-CM | POA: Diagnosis not present

## 2024-03-10 DIAGNOSIS — Z6823 Body mass index (BMI) 23.0-23.9, adult: Secondary | ICD-10-CM | POA: Diagnosis not present

## 2024-03-10 DIAGNOSIS — G609 Hereditary and idiopathic neuropathy, unspecified: Secondary | ICD-10-CM | POA: Diagnosis not present

## 2024-03-10 DIAGNOSIS — I10 Essential (primary) hypertension: Secondary | ICD-10-CM | POA: Diagnosis not present

## 2024-03-10 DIAGNOSIS — F411 Generalized anxiety disorder: Secondary | ICD-10-CM | POA: Diagnosis not present

## 2024-03-10 DIAGNOSIS — G894 Chronic pain syndrome: Secondary | ICD-10-CM | POA: Diagnosis not present

## 2024-06-03 DIAGNOSIS — E559 Vitamin D deficiency, unspecified: Secondary | ICD-10-CM | POA: Diagnosis not present

## 2024-06-06 DIAGNOSIS — F411 Generalized anxiety disorder: Secondary | ICD-10-CM | POA: Diagnosis not present

## 2024-06-06 DIAGNOSIS — I2510 Atherosclerotic heart disease of native coronary artery without angina pectoris: Secondary | ICD-10-CM | POA: Diagnosis not present

## 2024-06-06 DIAGNOSIS — M818 Other osteoporosis without current pathological fracture: Secondary | ICD-10-CM | POA: Diagnosis not present

## 2024-06-06 DIAGNOSIS — G894 Chronic pain syndrome: Secondary | ICD-10-CM | POA: Diagnosis not present

## 2024-06-06 DIAGNOSIS — F17218 Nicotine dependence, cigarettes, with other nicotine-induced disorders: Secondary | ICD-10-CM | POA: Diagnosis not present

## 2024-06-06 DIAGNOSIS — J449 Chronic obstructive pulmonary disease, unspecified: Secondary | ICD-10-CM | POA: Diagnosis not present

## 2024-06-06 DIAGNOSIS — G609 Hereditary and idiopathic neuropathy, unspecified: Secondary | ICD-10-CM | POA: Diagnosis not present

## 2024-06-06 DIAGNOSIS — R7301 Impaired fasting glucose: Secondary | ICD-10-CM | POA: Diagnosis not present

## 2024-06-06 DIAGNOSIS — Z23 Encounter for immunization: Secondary | ICD-10-CM | POA: Diagnosis not present

## 2024-06-06 DIAGNOSIS — I700 Atherosclerosis of aorta: Secondary | ICD-10-CM | POA: Diagnosis not present

## 2024-06-06 DIAGNOSIS — Z0001 Encounter for general adult medical examination with abnormal findings: Secondary | ICD-10-CM | POA: Diagnosis not present

## 2024-06-06 DIAGNOSIS — I10 Essential (primary) hypertension: Secondary | ICD-10-CM | POA: Diagnosis not present

## 2024-06-22 DIAGNOSIS — H2513 Age-related nuclear cataract, bilateral: Secondary | ICD-10-CM | POA: Diagnosis not present

## 2024-07-27 ENCOUNTER — Emergency Department (HOSPITAL_COMMUNITY)

## 2024-07-27 ENCOUNTER — Inpatient Hospital Stay (HOSPITAL_COMMUNITY)
Admission: EM | Admit: 2024-07-27 | Discharge: 2024-07-29 | DRG: 917 | Disposition: A | Discharge status: Home / Self Care | Attending: Family Medicine | Admitting: Family Medicine

## 2024-07-27 ENCOUNTER — Other Ambulatory Visit: Payer: Self-pay

## 2024-07-27 DIAGNOSIS — F1410 Cocaine abuse, uncomplicated: Secondary | ICD-10-CM | POA: Diagnosis not present

## 2024-07-27 DIAGNOSIS — G9341 Metabolic encephalopathy: Secondary | ICD-10-CM | POA: Diagnosis not present

## 2024-07-27 DIAGNOSIS — S199XXA Unspecified injury of neck, initial encounter: Secondary | ICD-10-CM | POA: Diagnosis not present

## 2024-07-27 DIAGNOSIS — K219 Gastro-esophageal reflux disease without esophagitis: Secondary | ICD-10-CM

## 2024-07-27 DIAGNOSIS — E782 Mixed hyperlipidemia: Secondary | ICD-10-CM

## 2024-07-27 DIAGNOSIS — J449 Chronic obstructive pulmonary disease, unspecified: Secondary | ICD-10-CM

## 2024-07-27 DIAGNOSIS — S0990XA Unspecified injury of head, initial encounter: Secondary | ICD-10-CM | POA: Diagnosis not present

## 2024-07-27 DIAGNOSIS — I10 Essential (primary) hypertension: Secondary | ICD-10-CM | POA: Diagnosis not present

## 2024-07-27 DIAGNOSIS — G9340 Encephalopathy, unspecified: Principal | ICD-10-CM

## 2024-07-27 DIAGNOSIS — R4182 Altered mental status, unspecified: Secondary | ICD-10-CM | POA: Diagnosis not present

## 2024-07-27 LAB — COMPREHENSIVE METABOLIC PANEL WITH GFR
ALT: 5 U/L (ref 0–44)
AST: 16 U/L (ref 15–41)
Albumin: 4.5 g/dL (ref 3.5–5.0)
Alkaline Phosphatase: 61 U/L (ref 38–126)
Anion gap: 16 — ABNORMAL HIGH (ref 5–15)
BUN: 9 mg/dL (ref 8–23)
CO2: 22 mmol/L (ref 22–32)
Calcium: 9.8 mg/dL (ref 8.9–10.3)
Chloride: 105 mmol/L (ref 98–111)
Creatinine, Ser: 0.55 mg/dL — ABNORMAL LOW (ref 0.61–1.24)
GFR, Estimated: >60 mL/min (ref >60)
Glucose, Bld: 122 mg/dL — ABNORMAL HIGH (ref 70–99)
Potassium: 3.6 mmol/L (ref 3.5–5.1)
Sodium: 142 mmol/L (ref 135–145)
Total Bilirubin: 1.1 mg/dL (ref 0.0–1.2)
Total Protein: 8.2 g/dL — ABNORMAL HIGH (ref 6.5–8.1)

## 2024-07-27 LAB — CSF CELL COUNT WITH DIFFERENTIAL
RBC Count, CSF: 22 /mm3 — ABNORMAL HIGH
RBC Count, CSF: 25 /mm3 — ABNORMAL HIGH
Tube #: 2
Tube #: 4
WBC, CSF: 4 /mm3 (ref 0–5)
WBC, CSF: 6 /mm3 — ABNORMAL HIGH (ref 0–5)

## 2024-07-27 LAB — CBC WITH DIFFERENTIAL/PLATELET
Abs Immature Granulocytes: 0.02 K/uL (ref 0.00–0.07)
Basophils Absolute: 0.1 K/uL (ref 0.0–0.1)
Basophils Relative: 1 %
Eosinophils Absolute: 0.2 K/uL (ref 0.0–0.5)
Eosinophils Relative: 2 %
HCT: 45.1 % (ref 39.0–52.0)
Hemoglobin: 15.5 g/dL (ref 13.0–17.0)
Immature Granulocytes: 0 %
Lymphocytes Relative: 21 %
Lymphs Abs: 2 K/uL (ref 0.7–4.0)
MCH: 32.4 pg (ref 26.0–34.0)
MCHC: 34.4 g/dL (ref 30.0–36.0)
MCV: 94.2 fL (ref 80.0–100.0)
Monocytes Absolute: 0.7 K/uL (ref 0.1–1.0)
Monocytes Relative: 8 %
Neutro Abs: 6.4 K/uL (ref 1.7–7.7)
Neutrophils Relative %: 68 %
Platelets: 370 K/uL (ref 150–400)
RBC: 4.79 MIL/uL (ref 4.22–5.81)
RDW: 13.1 % (ref 11.5–15.5)
WBC: 9.4 K/uL (ref 4.0–10.5)
nRBC: 0 % (ref 0.0–0.2)

## 2024-07-27 LAB — BLOOD GAS, VENOUS
Acid-Base Excess: 3.6 mmol/L — ABNORMAL HIGH (ref 0.0–2.0)
Bicarbonate: 29.2 mmol/L — ABNORMAL HIGH (ref 20.0–28.0)
Drawn by: 69260
O2 Saturation: 53.8 %
Patient temperature: 36.7
pCO2, Ven: 45 mmHg (ref 44–60)
pH, Ven: 7.41 (ref 7.25–7.43)
pO2, Ven: <31 mmHg — CL (ref 32–45)

## 2024-07-27 LAB — PROTEIN AND GLUCOSE, CSF
Glucose, CSF: 65 mg/dL (ref 40–70)
Total  Protein, CSF: 43 mg/dL (ref 15–45)

## 2024-07-27 LAB — URINE DRUG SCREEN
Amphetamines: NEGATIVE
Barbiturates: NEGATIVE
Benzodiazepines: NEGATIVE
Cocaine: POSITIVE — AB
Fentanyl: NEGATIVE
Methadone Scn, Ur: NEGATIVE
Opiates: NEGATIVE
Tetrahydrocannabinol: NEGATIVE

## 2024-07-27 LAB — URINALYSIS, ROUTINE W REFLEX MICROSCOPIC
Bilirubin Urine: NEGATIVE
Glucose, UA: NEGATIVE mg/dL
Hgb urine dipstick: NEGATIVE
Ketones, ur: NEGATIVE mg/dL
Leukocytes,Ua: NEGATIVE
Nitrite: NEGATIVE
Protein, ur: NEGATIVE mg/dL
Specific Gravity, Urine: 1.01 (ref 1.005–1.030)
pH: 6 (ref 5.0–8.0)

## 2024-07-27 LAB — RESP PANEL BY RT-PCR (RSV, FLU A&B, COVID)  RVPGX2
Influenza A by PCR: NEGATIVE
Influenza B by PCR: NEGATIVE
Resp Syncytial Virus by PCR: NEGATIVE
SARS Coronavirus 2 by RT PCR: NEGATIVE

## 2024-07-27 LAB — CBG MONITORING, ED: Glucose-Capillary: 128 mg/dL — ABNORMAL HIGH (ref 70–99)

## 2024-07-27 LAB — SALICYLATE LEVEL: Salicylate Lvl: <7 mg/dL — ABNORMAL LOW (ref 7.0–30.0)

## 2024-07-27 LAB — ACETAMINOPHEN LEVEL: Acetaminophen (Tylenol), Serum: <10 ug/mL — ABNORMAL LOW (ref 10–30)

## 2024-07-27 LAB — ETHANOL: Alcohol, Ethyl (B): <15 mg/dL (ref <15)

## 2024-07-27 MED ORDER — SODIUM CHLORIDE 0.9 % IV BOLUS
1000.0000 mL | Freq: Once | INTRAVENOUS | Status: AC
  Administered 2024-07-27: 1000 mL via INTRAVENOUS

## 2024-07-27 MED ORDER — SODIUM CHLORIDE 0.9 % IV SOLN: INTRAVENOUS | Status: AC

## 2024-07-27 MED ORDER — DEXTROSE 5 % IV SOLN
710.0000 mg | Freq: Three times a day (TID) | INTRAVENOUS | Status: DC
  Administered 2024-07-27 – 2024-07-28 (×3): 710 mg via INTRAVENOUS
  Filled 2024-07-27 (×6): qty 14.2

## 2024-07-27 MED ORDER — SODIUM CHLORIDE 0.9 % IV SOLN
2.0000 g | Freq: Once | INTRAVENOUS | Status: AC
  Administered 2024-07-27: 2 g via INTRAVENOUS
  Filled 2024-07-27: qty 20

## 2024-07-27 MED ORDER — ENOXAPARIN SODIUM 40 MG/0.4ML IJ SOSY
40.0000 mg | PREFILLED_SYRINGE | Freq: Every day | INTRAMUSCULAR | Status: DC
  Administered 2024-07-27 – 2024-07-28 (×2): 40 mg via SUBCUTANEOUS
  Filled 2024-07-27 (×2): qty 0.4

## 2024-07-27 MED ORDER — ACETAMINOPHEN 325 MG PO TABS: 650.0000 mg | ORAL_TABLET | Freq: Four times a day (QID) | ORAL | Status: DC | PRN

## 2024-07-27 MED ORDER — ACETAMINOPHEN 650 MG RE SUPP: 650.0000 mg | Freq: Four times a day (QID) | RECTAL | Status: DC | PRN

## 2024-07-27 MED ORDER — DEXAMETHASONE SOD PHOSPHATE PF 10 MG/ML IJ SOLN
10.0000 mg | Freq: Once | INTRAMUSCULAR | Status: AC
  Administered 2024-07-27: 10 mg via INTRAVENOUS

## 2024-07-27 MED ORDER — SODIUM CHLORIDE 0.9 % IV SOLN
2.0000 g | Freq: Once | INTRAVENOUS | Status: AC
  Administered 2024-07-27: 2 g via INTRAVENOUS
  Filled 2024-07-27 (×2): qty 2000

## 2024-07-27 MED ORDER — ONDANSETRON HCL 4 MG PO TABS: 4.0000 mg | ORAL_TABLET | Freq: Four times a day (QID) | ORAL | Status: DC | PRN

## 2024-07-27 MED ORDER — ONDANSETRON HCL 4 MG/2ML IJ SOLN: 4.0000 mg | Freq: Four times a day (QID) | INTRAMUSCULAR | Status: DC | PRN

## 2024-07-27 MED ORDER — PROPOFOL 10 MG/ML IV BOLUS
0.5000 mg/kg | Freq: Once | INTRAVENOUS | Status: AC
  Administered 2024-07-27: 35.4 mg via INTRAVENOUS
  Filled 2024-07-27: qty 20

## 2024-07-27 MED ORDER — NALOXONE HCL 0.4 MG/ML IJ SOLN
0.4000 mg | Freq: Once | INTRAMUSCULAR | Status: AC
  Administered 2024-07-27: 0.4 mg via INTRAVENOUS
  Filled 2024-07-27: qty 1

## 2024-07-27 MED ORDER — VANCOMYCIN HCL IN DEXTROSE 1-5 GM/200ML-% IV SOLN
1000.0000 mg | Freq: Once | INTRAVENOUS | Status: AC
  Administered 2024-07-27: 1000 mg via INTRAVENOUS
  Filled 2024-07-27: qty 200

## 2024-07-27 NOTE — ED Provider Notes (Signed)
 Care transferred to me.  Chest x-ray, CTs are all unremarkable.  UDS has cocaine in it but I am not sure if this fully explains his altered mental state.  I talked to the wife and it seems like he has been coming down with some sort of viral illness or cold for the last few days with sore throat, etc.  Took DayQuil once which made him feel better but has not been taking this excessively.  She does not think any of his meds have been taken more than normal.  She denies any illicit drug use.  He today has been substantially altered.  No prior history of significant psychiatric disease besides PTSD.  Due to all of this, I am concerned with a recent viral illness he perhaps has encephalitis.  LP was performed as below with sedation.  Started on broad antibiotics and antivirals to help cover for CNS infection.  Discussed with Dr. Adefeso for admission.  .Sedation  Date/Time: 07/27/2024 6:15 PM  Performed by: Freddi Hamilton, MD Authorized by: Freddi Hamilton, MD   Consent:    Consent obtained:  Written and verbal   Consent given by:  Spouse Universal protocol:    Immediately prior to procedure, a time out was called: yes     Patient identity confirmed:  Arm band Indications:    Procedure performed:  Lumbar puncture   Procedure necessitating sedation performed by:  Physician performing sedation Pre-sedation assessment:    Time since last food or drink:  Unknown   NPO status caution: urgency dictates proceeding with non-ideal NPO status     ASA classification: class 3 - patient with severe systemic disease     Mallampati score:  Unable to assess   Pre-sedation assessments completed and reviewed: airway patency, cardiovascular function, hydration status, mental status, nausea/vomiting, pain level, respiratory function and temperature   A pre-sedation assessment was completed prior to the start of the procedure Immediate pre-procedure details:    Reassessment: Patient reassessed immediately prior  to procedure     Reviewed: vital signs, relevant labs/tests and NPO status     Verified: bag valve mask available, emergency equipment available, intubation equipment available, IV patency confirmed, oxygen available and suction available   Procedure details (see MAR for exact dosages):    Preoxygenation:  Nasal cannula   Sedation:  Propofol    Intended level of sedation: deep   Analgesia:  None   Intra-procedure monitoring:  Blood pressure monitoring, cardiac monitor, continuous pulse oximetry, continuous capnometry, frequent LOC assessments and frequent vital sign checks   Intra-procedure events: none     Total Provider sedation time (minutes):  26 Post-procedure details:   A post-sedation assessment was completed following the completion of the procedure.   Attendance: Constant attendance by certified staff until patient recovered     Recovery: Patient returned to pre-procedure baseline     Post-sedation assessments completed and reviewed: airway patency, cardiovascular function, hydration status, mental status, nausea/vomiting, pain level, respiratory function and temperature     Patient is stable for discharge or admission: yes     Procedure completion:  Tolerated well, no immediate complications Lumbar Puncture  Date/Time: 07/27/2024 6:15 PM  Performed by: Freddi Hamilton, MD Authorized by: Freddi Hamilton, MD   Consent:    Consent obtained:  Written and verbal   Consent given by:  Spouse   Risks discussed:  Bleeding, headache, nerve damage, infection and pain   Alternatives discussed:  No treatment Universal protocol:    Patient identity confirmed:  Arm band Pre-procedure details:    Procedure purpose:  Diagnostic   Preparation: Patient was prepped and draped in usual sterile fashion   Anesthesia:    Anesthesia method:  Local infiltration Procedure details:    Lumbar space:  L4-L5 interspace   Patient position:  R lateral decubitus   Needle gauge:  22   Fluid appearance:   Blood-tinged then clearing   Tubes of fluid:  4   Total volume (ml):  4 Post-procedure details:    Puncture site:  Adhesive bandage applied   Procedure completion:  Tolerated well, no immediate complications .Critical Care  Performed by: Freddi Hamilton, MD Authorized by: Freddi Hamilton, MD   Critical care provider statement:    Critical care time (minutes):  35   Critical care time was exclusive of:  Separately billable procedures and treating other patients   Critical care was necessary to treat or prevent imminent or life-threatening deterioration of the following conditions:  CNS failure or compromise   Critical care was time spent personally by me on the following activities:  Development of treatment plan with patient or surrogate, discussions with consultants, evaluation of patient's response to treatment, examination of patient, ordering and review of laboratory studies, ordering and review of radiographic studies, ordering and performing treatments and interventions, pulse oximetry, re-evaluation of patient's condition and review of old charts     Freddi Hamilton, MD 07/27/24 2107
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
## 2024-07-27 NOTE — Consult Note (Signed)
 Pharmacy Antibiotic Note  Michael E Breeden Jr. is a 68 y.o. male admitted on 07/27/2024 with herpes encephalitis.  Pharmacy has been consulted for acyclovir  dosing.  Plan: Acyclovir  710 mg q8h with NS fluids at 125ml/hr  Height: 5' 5 (165.1 cm) Weight: 70.8 kg (156 lb 1.4 oz) IBW/kg (Calculated) : 61.5  Temp (24hrs), Avg:98.2 F (36.8 C), Min:98.1 F (36.7 C), Max:98.3 F (36.8 C)  Recent Labs  Lab 07/27/24 1448  WBC 9.4  CREATININE 0.55*    Estimated Creatinine Clearance: 76.9 mL/min (A) (by C-G formula based on SCr of 0.55 mg/dL (L)).    No Known Allergies  Antimicrobials this admission: 12/10 acyclovir  >>  12/10 ampicillin  x1 12/10 rocephin  x1 12/10 vancomycin  x1   Microbiology results: 12/10 Meningitis/Encephalitis panel: pending  Thank you for allowing pharmacy to be a part of this patients care.  Annabella LOISE Banks, PharmD Clinical Pharmacist 07/27/2024 6:33 PM

## 2024-07-27 NOTE — ED Notes (Signed)
 TO 1745  Prop 1745  3ml 1748  Numbing @ 1750  3ml 1755  1 ml 18036  1 ml 1805

## 2024-07-27 NOTE — ED Triage Notes (Signed)
 Pt presented to ED via [facility]. Pt was combatative  upon arrival and presented with bilat wrist restraints. EMS called out by significant  other due to AMS. Pt was next to a bottle of gabapentin that was fill 11/13 with a prescription 270 with 32 left in bottle. Pt told EMS that he fell yesterday. Pt's pupils equal and sluggish to light. CBG per EMS 117.
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
## 2024-07-28 ENCOUNTER — Encounter (HOSPITAL_COMMUNITY): Payer: Self-pay | Admitting: Internal Medicine

## 2024-07-28 ENCOUNTER — Other Ambulatory Visit: Payer: Self-pay

## 2024-07-28 LAB — COMPREHENSIVE METABOLIC PANEL WITH GFR
ALT: 7 U/L (ref 0–44)
AST: 15 U/L (ref 15–41)
Albumin: 3.9 g/dL (ref 3.5–5.0)
Alkaline Phosphatase: 55 U/L (ref 38–126)
Anion gap: 9 (ref 5–15)
BUN: 6 mg/dL — ABNORMAL LOW (ref 8–23)
CO2: 26 mmol/L (ref 22–32)
Calcium: 8.8 mg/dL — ABNORMAL LOW (ref 8.9–10.3)
Chloride: 106 mmol/L (ref 98–111)
Creatinine, Ser: 0.42 mg/dL — ABNORMAL LOW (ref 0.61–1.24)
GFR, Estimated: >60 mL/min (ref >60)
Glucose, Bld: 116 mg/dL — ABNORMAL HIGH (ref 70–99)
Potassium: 3.5 mmol/L (ref 3.5–5.1)
Sodium: 141 mmol/L (ref 135–145)
Total Bilirubin: 0.7 mg/dL (ref 0.0–1.2)
Total Protein: 6.8 g/dL (ref 6.5–8.1)

## 2024-07-28 LAB — MENINGITIS/ENCEPHALITIS PANEL (CSF)

## 2024-07-28 LAB — HIV ANTIBODY (ROUTINE TESTING W REFLEX): HIV Screen 4th Generation wRfx: NONREACTIVE

## 2024-07-28 LAB — CBC
HCT: 39.1 % (ref 39.0–52.0)
Hemoglobin: 13.3 g/dL (ref 13.0–17.0)
MCH: 31.7 pg (ref 26.0–34.0)
MCHC: 34 g/dL (ref 30.0–36.0)
MCV: 93.3 fL (ref 80.0–100.0)
Platelets: 331 K/uL (ref 150–400)
RBC: 4.19 MIL/uL — ABNORMAL LOW (ref 4.22–5.81)
RDW: 13 % (ref 11.5–15.5)
WBC: 7 K/uL (ref 4.0–10.5)
nRBC: 0 % (ref 0.0–0.2)

## 2024-07-28 LAB — PHOSPHORUS: Phosphorus: 1.9 mg/dL — ABNORMAL LOW (ref 2.5–4.6)

## 2024-07-28 LAB — MAGNESIUM: Magnesium: 2 mg/dL (ref 1.7–2.4)

## 2024-07-28 MED ORDER — PRAVASTATIN SODIUM 10 MG PO TABS
10.0000 mg | ORAL_TABLET | Freq: Every day | ORAL | Status: DC
  Administered 2024-07-28 – 2024-07-29 (×2): 10 mg via ORAL
  Filled 2024-07-28 (×2): qty 1

## 2024-07-28 MED ORDER — AMLODIPINE BESYLATE 5 MG PO TABS
5.0000 mg | ORAL_TABLET | Freq: Every day | ORAL | Status: DC
  Administered 2024-07-28 – 2024-07-29 (×2): 5 mg via ORAL
  Filled 2024-07-28 (×2): qty 1

## 2024-07-28 MED ORDER — ALBUTEROL SULFATE (2.5 MG/3ML) 0.083% IN NEBU: 3.0000 mL | INHALATION_SOLUTION | RESPIRATORY_TRACT | Status: DC | PRN

## 2024-07-28 MED ORDER — K PHOS MONO-SOD PHOS DI & MONO 155-852-130 MG PO TABS
500.0000 mg | ORAL_TABLET | Freq: Once | ORAL | Status: AC
  Administered 2024-07-28: 500 mg via ORAL
  Filled 2024-07-28: qty 2

## 2024-07-28 MED ORDER — OXYCODONE-ACETAMINOPHEN 5-325 MG PO TABS
1.0000 | ORAL_TABLET | Freq: Four times a day (QID) | ORAL | Status: DC | PRN
  Administered 2024-07-28 – 2024-07-29 (×2): 1 via ORAL
  Filled 2024-07-28 (×2): qty 1

## 2024-07-28 MED ORDER — PANTOPRAZOLE SODIUM 40 MG PO TBEC
40.0000 mg | DELAYED_RELEASE_TABLET | Freq: Every day | ORAL | Status: DC
  Administered 2024-07-28 – 2024-07-29 (×2): 40 mg via ORAL
  Filled 2024-07-28 (×2): qty 1

## 2024-07-28 MED ADMIN — Gabapentin Cap 300 MG: 600 mg | ORAL | NDC 16714066201

## 2024-07-28 MED FILL — Gabapentin Cap 300 MG: 600.0000 mg | ORAL | Qty: 2 | Status: AC

## 2024-07-28 NOTE — TOC CM/SW Note (Signed)
 Transition of Care [facility]) - Inpatient Brief Assessment   Patient Details  Name: Michael Bass. MRN: [id number] Date of Birth: 1955-09-04  Transition of Care [facility]) CM/SW Contact:    Lucie Lunger, LCSWA Phone Number: 07/28/2024, 8:52 AM   Clinical Narrative: Transition of Care Department [facility]) has reviewed patient and no TOC needs have been identified at this time. We will continue to monitor patient advancement through interdiciplinary progression rounds. If new patient transition needs arise, please place a TOC consult.  Transition of Care Asessment: Insurance and Status: Insurance coverage has been reviewed Patient has primary care physician: Yes Home environment has been reviewed: From home Prior level of function:: Independent Prior/Current Home Services: No current home services Social Drivers of Health Review: SDOH reviewed no interventions necessary Readmission risk has been reviewed: Yes Transition of care needs: no transition of care needs at this time

## 2024-07-28 NOTE — Plan of Care (Signed)
  Problem: Education: Goal: Knowledge of General Education information will improve Description: Including pain rating scale, medication(s)/side effects and non-pharmacologic comfort measures Outcome: Progressing   Problem: Health Behavior/Discharge Planning: Goal: Ability to manage health-related needs will improve Outcome: Progressing   Problem: Clinical Measurements: Goal: Respiratory complications will improve Outcome: Progressing Goal: Cardiovascular complication will be avoided Outcome: Progressing   Problem: Activity: Goal: Risk for activity intolerance will decrease Outcome: Progressing   Problem: Coping: Goal: Level of anxiety will decrease Outcome: Progressing   Problem: Pain Managment: Goal: General experience of comfort will improve and/or be controlled Outcome: Progressing

## 2024-07-28 NOTE — Progress Notes (Addendum)
 PROGRESS NOTE  Michael Bass.  FMW:[id number] DOB: 01-Jul-1956 DOA: 07/27/2024 PCP: Shona Norleen PEDLAR, MD  Consultants  Brief Narrative: Michael Cislo. is a 68 y.o. male with medical history significant of hypertension, chronic pain, astigmatism who presents to the emergency department due to altered mental status.  EMS was activated by family and on arrival of EMS team, patient was altered, combative and agitated and patient was initially thought to have overdosed on his gabapentin, but on checking the amount of pills in the bottle, it corresponds to normal compliance with the medication.  Bilateral wrist restraint was placed by EMS team due to patient's agitation.   ED course In the emergency department, he was hemodynamically stable.  Workup was positive for cocaine.  CT head and cervical spine showed no acute intracranial abnormality.  Spinal tap was done and showed WBC 6, RBC 25.  Meningitis/encephalitis panel was unremarkable. Patient was empirically treated with IV ceftriaxone , ampicillin , vancomycin , Decadron .  IV Zovirax  was also started.   Assessment & Plan: Acute metabolic encephalopathy r/o viral meningitis/encephalopathy Much more awake and alert today. - Sister and wife at bedside.  Reports that this was sudden change that occurred yesterday. - Patient today admits to taking multiple Flexeril plus drinking alcohol  yesterday prior to coming into the emergency department. - Sister and wife confirm they could not awaken him yesterday afternoon so they called for EMS to bring him.   - Did have viral URI symptoms about a week ago but family and patient both report this has improved since that time. - Patient was empirically treated with IV ampicillin , ceftriaxone , vancomycin , Decadron  and Zovirax --> acyclovir  IV stopped 12/11 has meningitis/encephalitis panel was negative Continue fall precaution, delirium precaution -Seems more likely that alcohol /substance abuse was because of his  sudden altered mental status especially in light of the fact he looks so well today compared to yesterday.  Polypharmacy: - Patient is on a host of medications that are potentially sedating. - On top of this he was positive for cocaine in his UDS yesterday and also endorses alcohol  use yesterday as well taking multiple Flexeril on top of his usual 1200 mg gabapentin 3 times daily, Klonopin, oxycodone. -Believe this is more likely because of normal and above.   Cocaine abuse Patient will be counseled on cocaine abuse cessation  - Also reported alcohol  use yesterday as well.   Essential hypertension Continue amlodipine   Mixed hyperlipidemia Continue pravastatin   GERD Continue Protonix   COPD Continue albuterol  Hypophosphatemia: - Replace with K-Phos. - Patient reports weight loss over the past 2 years.  Will trend phosphorus.         DVT prophylaxis:  enoxaparin  (LOVENOX ) injection 40 mg Start: 07/27/24 2200 SCDs Start: 07/27/24 2037  Code Status:   Code Status: Full Code Family Communication: Sister and wife at bedside, all questions answered Level of care: Telemetry Status is: Inpatient Dispo: If he continues to do well possibly will discharge home 12/12.   Subjective: Patient seated in chair.  Speaking in full sentences, calm.  Does not remember coming to the emergency department yesterday.  No complaints today.  Objective: Vitals:   07/27/24 1823 07/27/24 1830 07/27/24 1900 07/27/24 2100  BP: 138/83 130/87 (!) 128/90 132/85  Pulse: 90 89 95 83  Resp: 20 18 (!) 23 18  Temp:    97.8 F (36.6 C)  TempSrc:    Oral  SpO2: 98% 100% 99% 98%  Weight:      Height:  Intake/Output Summary (Last 24 hours) at 07/28/2024 1442 Last data filed at 07/28/2024 0400 Gross per 24 hour  Intake 1017.5 ml  Output 3100 ml  Net -2082.5 ml   Filed Weights   07/27/24 1431  Weight: 70.8 kg   Body mass index is 25.97 kg/m.  Gen: 68 y.o. male in no apparent  distress.  Nontoxic, somewhat disheveled appearing Pulm: Non-labored breathing.  Clear to auscultation bilaterally.  CV: Regular rate and rhythm. No murmur, rub, or gallop. No JVD GI: Abdomen soft, non-tender, non-distended, with normoactive bowel sounds. No organomegaly or masses felt. Ext: Warm, no deformities,  Skin: No rashes, lesions no ulcers Neuro: Alert and oriented to person and year.  Not oriented to place.  Moving all limbs symmetrically. Psych: Calm     I have personally reviewed the following labs and images: CBC: Recent Labs  Lab 07/27/24 1448 07/28/24 0559  WBC 9.4 7.0  NEUTROABS 6.4  --   HGB 15.5 13.3  HCT 45.1 39.1  MCV 94.2 93.3  PLT 370 331   BMP &GFR Recent Labs  Lab 07/27/24 1448 07/28/24 0559  NA 142 141  K 3.6 3.5  CL 105 106  CO2 22 26  GLUCOSE 122* 116*  BUN 9 6*  CREATININE 0.55* 0.42*  CALCIUM 9.8 8.8*  MG  --  2.0  PHOS  --  1.9*   Estimated Creatinine Clearance: 76.9 mL/min (A) (by C-G formula based on SCr of 0.42 mg/dL (L)). Liver & Pancreas: Recent Labs  Lab 07/27/24 1448 07/28/24 0559  AST 16 15  ALT 5 7  ALKPHOS 61 55  BILITOT 1.1 0.7  PROT 8.2* 6.8  ALBUMIN 4.5 3.9   No results for input(s): LIPASE, AMYLASE in the last 168 hours. No results for input(s): AMMONIA in the last 168 hours. Diabetic: No results for input(s): HGBA1C in the last 72 hours. Recent Labs  Lab 07/27/24 1433  GLUCAP 128*   Cardiac Enzymes: No results for input(s): CKTOTAL, CKMB, CKMBINDEX, TROPONINI in the last 168 hours. No results for input(s): PROBNP in the last 8760 hours. Coagulation Profile: No results for input(s): INR, PROTIME in the last 168 hours. Thyroid  Function Tests: No results for input(s): TSH, T4TOTAL, FREET4, T3FREE, THYROIDAB in the last 72 hours. Lipid Profile: No results for input(s): CHOL, HDL, LDLCALC, TRIG, CHOLHDL, LDLDIRECT in the last 72 hours. Anemia Panel: No results for  input(s): VITAMINB12, FOLATE, FERRITIN, TIBC, IRON, RETICCTPCT in the last 72 hours. Urine analysis:    Component Value Date/Time   COLORURINE YELLOW 07/27/2024 1622   APPEARANCEUR CLEAR 07/27/2024 1622   LABSPEC 1.010 07/27/2024 1622   PHURINE 6.0 07/27/2024 1622   GLUCOSEU NEGATIVE 07/27/2024 1622   HGBUR NEGATIVE 07/27/2024 1622   BILIRUBINUR NEGATIVE 07/27/2024 1622   KETONESUR NEGATIVE 07/27/2024 1622   PROTEINUR NEGATIVE 07/27/2024 1622   NITRITE NEGATIVE 07/27/2024 1622   LEUKOCYTESUR NEGATIVE 07/27/2024 1622   Sepsis Labs: Invalid input(s): PROCALCITONIN, LACTICIDVEN  Microbiology: Recent Results (from the past 240 hours)  Resp panel by RT-PCR (RSV, Flu A&B, Covid) Anterior Nasal Swab     Status: None   Collection Time: 07/27/24  4:27 PM   Specimen: Anterior Nasal Swab  Result Value Ref Range Status   SARS Coronavirus 2 by RT PCR NEGATIVE NEGATIVE Final    Comment: (NOTE) SARS-CoV-2 target nucleic acids are NOT DETECTED.  The SARS-CoV-2 RNA is generally detectable in upper respiratory specimens during the acute phase of infection. The lowest concentration of SARS-CoV-2 viral copies this  assay can detect is 138 copies/mL. A negative result does not preclude SARS-Cov-2 infection and should not be used as the sole basis for treatment or other patient management decisions. A negative result may occur with  improper specimen collection/handling, submission of specimen other than nasopharyngeal swab, presence of viral mutation(s) within the areas targeted by this assay, and inadequate number of viral copies(<138 copies/mL). A negative result must be combined with clinical observations, patient history, and epidemiological information. The expected result is Negative.  Fact Sheet for Patients:  bloggercourse.com  Fact Sheet for Healthcare Providers:  seriousbroker.it  This test is no t yet approved or  cleared by the [place]  FDA and  has been authorized for detection and/or diagnosis of SARS-CoV-2 by FDA under an Emergency Use Authorization (EUA). This EUA will remain  in effect (meaning this test can be used) for the duration of the COVID-19 declaration under Section 564(b)(1) of the Act, 21 U.S.C.section 360bbb-3(b)(1), unless the authorization is terminated  or revoked sooner.       Influenza A by PCR NEGATIVE NEGATIVE Final   Influenza B by PCR NEGATIVE NEGATIVE Final    Comment: (NOTE) The Xpert Xpress SARS-CoV-2/FLU/RSV plus assay is intended as an aid in the diagnosis of influenza from Nasopharyngeal swab specimens and should not be used as a sole basis for treatment. Nasal washings and aspirates are unacceptable for Xpert Xpress SARS-CoV-2/FLU/RSV testing.  Fact Sheet for Patients: bloggercourse.com  Fact Sheet for Healthcare Providers: seriousbroker.it  This test is not yet approved or cleared by the [place]  FDA and has been authorized for detection and/or diagnosis of SARS-CoV-2 by FDA under an Emergency Use Authorization (EUA). This EUA will remain in effect (meaning this test can be used) for the duration of the COVID-19 declaration under Section 564(b)(1) of the Act, 21 U.S.C. section 360bbb-3(b)(1), unless the authorization is terminated or revoked.     Resp Syncytial Virus by PCR NEGATIVE NEGATIVE Final    Comment: (NOTE) Fact Sheet for Patients: bloggercourse.com  Fact Sheet for Healthcare Providers: seriousbroker.it  This test is not yet approved or cleared by the [place]  FDA and has been authorized for detection and/or diagnosis of SARS-CoV-2 by FDA under an Emergency Use Authorization (EUA). This EUA will remain in effect (meaning this test can be used) for the duration of the COVID-19 declaration under Section 564(b)(1) of the Act, 21  U.S.C. section 360bbb-3(b)(1), unless the authorization is terminated or revoked.  Performed at [facility], [address] [place], [place] [address]   CSF culture w Gram Stain     Status: None (Preliminary result)   Collection Time: 07/27/24  6:16 PM   Specimen: CSF; Cerebrospinal Fluid  Result Value Ref Range Status   Specimen Description   Final    CSF Performed at [facility], [address], [place], [place] [address]    Special Requests   Final    NONE Performed at [facility], [address] [place], [place] [address]    Gram Stain   Final    CYTOSPIN SMEAR WBC PRESENT, PREDOMINANTLY PMN NO ORGANISMS SEEN Performed at [facility], [address] [place], [place] [address]    Culture   Final    NO GROWTH < 12 HOURS Performed at [facility], [address] [place], [place] [address]    Report Status PENDING  Incomplete    Radiology Studies: CT HEAD WO CONTRAST Result Date: 07/27/2024 EXAM: CT HEAD AND CERVICAL SPINE 07/27/2024 04:25:09 PM  TECHNIQUE: CT of the head and cervical spine was performed without the administration of intravenous contrast. Multiplanar reformatted images are provided for review. Automated exposure control, iterative reconstruction, and/or weight based adjustment of the mA/kV was utilized to reduce the radiation dose to as low as reasonably achievable. COMPARISON: None available. CLINICAL HISTORY: Altered mental status, nontraumatic (Ped 0-17y); trauma, head injury, altered FINDINGS: CT HEAD BRAIN AND VENTRICLES: No acute intracranial hemorrhage. No mass effect or midline shift. No abnormal extra-axial fluid collection. No evidence of acute infarct. No hydrocephalus. ORBITS: No acute abnormality. SINUSES AND MASTOIDS: No acute abnormality. SOFT TISSUES AND SKULL: No acute skull fracture. No acute soft tissue abnormality. CT CERVICAL SPINE BONES AND ALIGNMENT: No acute fracture or traumatic malalignment. Sclerotic C4 bone lesion. DEGENERATIVE  CHANGES: No significant degenerative changes. SOFT TISSUES: No prevertebral soft tissue swelling. IMPRESSION: 1. No acute intracranial abnormality. 2. No acute fracture or traumatic malalignment of the cervical spine. 3. Sclerotic C4 bone lesion, most likely a benign bone island in the absence of a known primary malignancy. Electronically signed by: Gilmore Molt MD 07/27/2024 04:48 PM EST RP Workstation: [id number]   CT Cervical Spine Wo Contrast Result Date: 07/27/2024 EXAM: CT HEAD AND CERVICAL SPINE 07/27/2024 04:25:09 PM TECHNIQUE: CT of the head and cervical spine was performed without the administration of intravenous contrast. Multiplanar reformatted images are provided for review. Automated exposure control, iterative reconstruction, and/or weight based adjustment of the mA/kV was utilized to reduce the radiation dose to as low as reasonably achievable. COMPARISON: None available. CLINICAL HISTORY: Altered mental status, nontraumatic (Ped 0-17y); trauma, head injury, altered FINDINGS: CT HEAD BRAIN AND VENTRICLES: No acute intracranial hemorrhage. No mass effect or midline shift. No abnormal extra-axial fluid collection. No evidence of acute infarct. No hydrocephalus. ORBITS: No acute abnormality. SINUSES AND MASTOIDS: No acute abnormality. SOFT TISSUES AND SKULL: No acute skull fracture. No acute soft tissue abnormality. CT CERVICAL SPINE BONES AND ALIGNMENT: No acute fracture or traumatic malalignment. Sclerotic C4 bone lesion. DEGENERATIVE CHANGES: No significant degenerative changes. SOFT TISSUES: No prevertebral soft tissue swelling. IMPRESSION: 1. No acute intracranial abnormality. 2. No acute fracture or traumatic malalignment of the cervical spine. 3. Sclerotic C4 bone lesion, most likely a benign bone island in the absence of a known primary malignancy. Electronically signed by: Gilmore Molt MD 07/27/2024 04:48 PM EST RP Workstation: [id number]   DG Chest Port 1 View Result Date:  07/27/2024 CLINICAL DATA:  Cough. EXAM: PORTABLE CHEST 1 VIEW COMPARISON:  Chest CT dated 12/26/2022. FINDINGS: No focal consolidation, pleural effusion or pneumothorax. There is diffuse chronic interstitial coarsening. The cardiac silhouette is within normal limits. Osteopenia with degenerative changes of spine. No acute osseous pathology. IMPRESSION: No active disease. Electronically Signed   By: Vanetta Chou M.D.   On: 07/27/2024 16:37    Scheduled Meds:  amLODipine  5 mg Oral Daily   enoxaparin  (LOVENOX ) injection  40 mg Subcutaneous QHS   gabapentin  600 mg Oral BID   pantoprazole  40 mg Oral Daily   pravastatin  10 mg Oral Daily   Continuous Infusions:  sodium chloride 125 mL/hr at 07/28/24 0514     LOS: 1 day   35 minutes with more than 50% spent in reviewing records, counseling patient/family and coordinating care.  Michael VEAR Gaw, MD [facility] [email or web address] 07/28/2024, 2:42 PM

## 2024-07-29 DIAGNOSIS — Z79899 Other long term (current) drug therapy: Secondary | ICD-10-CM

## 2024-07-29 DIAGNOSIS — I10 Essential (primary) hypertension: Secondary | ICD-10-CM | POA: Insufficient documentation

## 2024-07-29 DIAGNOSIS — F1410 Cocaine abuse, uncomplicated: Secondary | ICD-10-CM | POA: Insufficient documentation

## 2024-07-29 LAB — BASIC METABOLIC PANEL WITH GFR
Anion gap: 9 (ref 5–15)
BUN: 8 mg/dL (ref 8–23)
CO2: 26 mmol/L (ref 22–32)
Calcium: 8.5 mg/dL — ABNORMAL LOW (ref 8.9–10.3)
Chloride: 109 mmol/L (ref 98–111)
Creatinine, Ser: 0.56 mg/dL — ABNORMAL LOW (ref 0.61–1.24)
GFR, Estimated: >60 mL/min (ref >60)
Glucose, Bld: 86 mg/dL (ref 70–99)
Potassium: 3.1 mmol/L — ABNORMAL LOW (ref 3.5–5.1)
Sodium: 143 mmol/L (ref 135–145)

## 2024-07-29 LAB — CBC
HCT: 35.8 % — ABNORMAL LOW (ref 39.0–52.0)
Hemoglobin: 12.2 g/dL — ABNORMAL LOW (ref 13.0–17.0)
MCH: 32.1 pg (ref 26.0–34.0)
MCHC: 34.1 g/dL (ref 30.0–36.0)
MCV: 94.2 fL (ref 80.0–100.0)
Platelets: 298 K/uL (ref 150–400)
RBC: 3.8 MIL/uL — ABNORMAL LOW (ref 4.22–5.81)
RDW: 13.2 % (ref 11.5–15.5)
WBC: 9.4 K/uL (ref 4.0–10.5)
nRBC: 0 % (ref 0.0–0.2)

## 2024-07-29 LAB — MAGNESIUM: Magnesium: 2 mg/dL (ref 1.7–2.4)

## 2024-07-29 LAB — PHOSPHORUS: Phosphorus: 2.7 mg/dL (ref 2.5–4.6)

## 2024-07-29 MED ORDER — GABAPENTIN 600 MG PO TABS: 600.0000 mg | ORAL_TABLET | Freq: Two times a day (BID) | ORAL | Status: AC

## 2024-07-29 MED ADMIN — Gabapentin Cap 300 MG: 600 mg | ORAL | NDC 16714066201

## 2024-07-29 NOTE — Plan of Care (Signed)

## 2024-07-29 NOTE — Plan of Care (Signed)
  Problem: Education: Goal: Knowledge of General Education information will improve Description: Including pain rating scale, medication(s)/side effects and non-pharmacologic comfort measures Outcome: Progressing   Problem: Clinical Measurements: Goal: Ability to maintain clinical measurements within normal limits will improve Outcome: Progressing   Problem: Nutrition: Goal: Adequate nutrition will be maintained Outcome: Progressing   Problem: Pain Managment: Goal: General experience of comfort will improve and/or be controlled Outcome: Progressing   Problem: Safety: Goal: Ability to remain free from injury will improve Outcome: Progressing   Problem: Skin Integrity: Goal: Risk for impaired skin integrity will decrease Outcome: Progressing

## 2024-07-29 NOTE — Discharge Summary (Addendum)
 Physician Discharge Summary   Patient: Michael Bass. MRN: [id number] DOB: 06/17/1956  Admit date:     07/27/2024  Discharge date: 07/29/2024  Discharge Physician: Concepcion Riser   PCP: Shona Norleen PEDLAR, MD   Recommendations at discharge:    PCP follow up in 1 week.  Discharge Diagnoses: Principal Problem:   Acute metabolic encephalopathy Active Problems:   Polypharmacy   Cocaine abuse (HCC)   HTN (hypertension)  Resolved Problems:   * No resolved hospital problems. *  Hospital Course: Michael Bass. is a 68 y.o. male with medical history significant of hypertension, chronic pain, astigmatism who presents to the emergency department due to altered mental status.  EMS was activated by family and on arrival of EMS team, patient was altered, combative and agitated and patient was initially thought to have overdosed on his gabapentin, but on checking the amount of pills in the bottle, it corresponds to normal compliance with the medication.  Bilateral wrist restraint was placed by EMS team due to patient's agitation. Found to have u tox positive for cocaine. Admitted to [facility] service for further management.  Assessment and Plan: Acute metabolic encephalopathy  Viral meningitis/encephalopathy ruled out. In the setting of electrolyte abnormalities, multiple sedative medications, pain medications, cocaine abuse. Meningitis, encephalitis ruled out. Patient's mental status back to baseline, able to ambulate independently. I discussed with him the need to stop using cocaine, stop taking multiple pain medications, muscle relaxant, sedatives.  He understands and agrees.  Advised him to follow-up with PCP upon discharge as instructed.   Polypharmacy: Patient is on multiple sedative medications including Flexeril,1200 mg gabapentin 3 times daily, Klonopin, oxycodone. Possible cause of AMS on presentation. Counseled to stop flexeril, klonopin, oxy. Decreased dose of  gabapentin. Advised to follow PCP on discharge.   Cocaine abuse Patient counseled on cocaine abuse cessation and also alcohol  use.  Essential hypertension Continue amlodipine   Mixed hyperlipidemia Continue pravastatin   GERD Continue Protonix   COPD Continue albuterol   Hypophosphatemia: Replaced.        Consultants: None Procedures performed: None Disposition: Home Diet recommendation:  Cardiac diet DISCHARGE MEDICATION: Allergies as of 07/29/2024   No Known Allergies      Medication List     STOP taking these medications    clonazePAM 0.5 MG tablet Commonly known as: KLONOPIN   cyclobenzaprine 10 MG tablet Commonly known as: FLEXERIL       TAKE these medications    albuterol 108 (90 Base) MCG/ACT inhaler Commonly known as: VENTOLIN HFA Inhale 2 puffs into the lungs every 4 (four) hours as needed for wheezing or shortness of breath.   alendronate 70 MG tablet Commonly known as: FOSAMAX Take 70 mg by mouth every Tuesday. Take with a full glass of water on an empty stomach.   amLODipine 5 MG tablet Commonly known as: NORVASC Take 5 mg by mouth daily.   gabapentin 600 MG tablet Commonly known as: NEURONTIN Take 1 tablet (600 mg total) by mouth 2 (two) times daily. What changed:  how much to take when to take this   meloxicam 15 MG tablet Commonly known as: MOBIC Take 15 mg by mouth daily as needed for pain.   oxyCODONE-acetaminophen 5-325 MG tablet Commonly known as: PERCOCET/ROXICET Take 1 tablet by mouth 4 (four) times daily as needed for pain.   pantoprazole 40 MG tablet Commonly known as: PROTONIX Take 40 mg by mouth daily.   pravastatin 10 MG tablet Commonly known as: PRAVACHOL  Take 10 mg by mouth daily.   Vitamin D (Ergocalciferol) 1.25 MG (50000 UNIT) Caps capsule Commonly known as: DRISDOL Take 50,000 Units by mouth every Tuesday.        Discharge Exam: Filed Weights   07/27/24 1431  Weight: 70.8 kg       07/29/2024    1:10 PM 07/29/2024    4:32 AM 07/28/2024    8:47 PM  Vitals with BMI  Systolic 115 128 876  Diastolic 67 75 78  Pulse 74 73 85  General - Elderly thin built Caucasian male, no apparent distress HEENT - PERRLA, EOMI, atraumatic head, non tender sinuses. Lung - Clear, no rales, rhonchi, wheezes. Heart - S1, S2 heard, no murmurs, rubs, no pedal edema. Abdomen - Soft, non tender, bowel sounds good Neuro - Alert, awake and oriented x 3, non focal exam. Skin - Warm and dry.  Condition at discharge: stable  The results of significant diagnostics from this hospitalization (including imaging, microbiology, ancillary and laboratory) are listed below for reference.   Imaging Studies: CT HEAD WO CONTRAST Result Date: 07/27/2024 EXAM: CT HEAD AND CERVICAL SPINE 07/27/2024 04:25:09 PM TECHNIQUE: CT of the head and cervical spine was performed without the administration of intravenous contrast. Multiplanar reformatted images are provided for review. Automated exposure control, iterative reconstruction, and/or weight based adjustment of the mA/kV was utilized to reduce the radiation dose to as low as reasonably achievable. COMPARISON: None available. CLINICAL HISTORY: Altered mental status, nontraumatic (Ped 0-17y); trauma, head injury, altered FINDINGS: CT HEAD BRAIN AND VENTRICLES: No acute intracranial hemorrhage. No mass effect or midline shift. No abnormal extra-axial fluid collection. No evidence of acute infarct. No hydrocephalus. ORBITS: No acute abnormality. SINUSES AND MASTOIDS: No acute abnormality. SOFT TISSUES AND SKULL: No acute skull fracture. No acute soft tissue abnormality. CT CERVICAL SPINE BONES AND ALIGNMENT: No acute fracture or traumatic malalignment. Sclerotic C4 bone lesion. DEGENERATIVE CHANGES: No significant degenerative changes. SOFT TISSUES: No prevertebral soft tissue swelling. IMPRESSION: 1. No acute intracranial abnormality. 2. No acute fracture or traumatic  malalignment of the cervical spine. 3. Sclerotic C4 bone lesion, most likely a benign bone island in the absence of a known primary malignancy. Electronically signed by: Gilmore Molt MD 07/27/2024 04:48 PM EST RP Workstation: [id number]   CT Cervical Spine Wo Contrast Result Date: 07/27/2024 EXAM: CT HEAD AND CERVICAL SPINE 07/27/2024 04:25:09 PM TECHNIQUE: CT of the head and cervical spine was performed without the administration of intravenous contrast. Multiplanar reformatted images are provided for review. Automated exposure control, iterative reconstruction, and/or weight based adjustment of the mA/kV was utilized to reduce the radiation dose to as low as reasonably achievable. COMPARISON: None available. CLINICAL HISTORY: Altered mental status, nontraumatic (Ped 0-17y); trauma, head injury, altered FINDINGS: CT HEAD BRAIN AND VENTRICLES: No acute intracranial hemorrhage. No mass effect or midline shift. No abnormal extra-axial fluid collection. No evidence of acute infarct. No hydrocephalus. ORBITS: No acute abnormality. SINUSES AND MASTOIDS: No acute abnormality. SOFT TISSUES AND SKULL: No acute skull fracture. No acute soft tissue abnormality. CT CERVICAL SPINE BONES AND ALIGNMENT: No acute fracture or traumatic malalignment. Sclerotic C4 bone lesion. DEGENERATIVE CHANGES: No significant degenerative changes. SOFT TISSUES: No prevertebral soft tissue swelling. IMPRESSION: 1. No acute intracranial abnormality. 2. No acute fracture or traumatic malalignment of the cervical spine. 3. Sclerotic C4 bone lesion, most likely a benign bone island in the absence of a known primary malignancy. Electronically signed by: Gilmore Molt MD 07/27/2024 04:48 PM EST RP Workstation:  [id number]   DG Chest Port 1 View Result Date: 07/27/2024 CLINICAL DATA:  Cough. EXAM: PORTABLE CHEST 1 VIEW COMPARISON:  Chest CT dated 12/26/2022. FINDINGS: No focal consolidation, pleural effusion or pneumothorax. There is diffuse  chronic interstitial coarsening. The cardiac silhouette is within normal limits. Osteopenia with degenerative changes of spine. No acute osseous pathology. IMPRESSION: No active disease. Electronically Signed   By: Vanetta Chou M.D.   On: 07/27/2024 16:37    Microbiology: Results for orders placed or performed during the hospital encounter of 07/27/24  Resp panel by RT-PCR (RSV, Flu A&B, Covid) Anterior Nasal Swab     Status: None   Collection Time: 07/27/24  4:27 PM   Specimen: Anterior Nasal Swab  Result Value Ref Range Status   SARS Coronavirus 2 by RT PCR NEGATIVE NEGATIVE Final    Comment: (NOTE) SARS-CoV-2 target nucleic acids are NOT DETECTED.  The SARS-CoV-2 RNA is generally detectable in upper respiratory specimens during the acute phase of infection. The lowest concentration of SARS-CoV-2 viral copies this assay can detect is 138 copies/mL. A negative result does not preclude SARS-Cov-2 infection and should not be used as the sole basis for treatment or other patient management decisions. A negative result may occur with  improper specimen collection/handling, submission of specimen other than nasopharyngeal swab, presence of viral mutation(s) within the areas targeted by this assay, and inadequate number of viral copies(<138 copies/mL). A negative result must be combined with clinical observations, patient history, and epidemiological information. The expected result is Negative.  Fact Sheet for Patients:  bloggercourse.com  Fact Sheet for Healthcare Providers:  seriousbroker.it  This test is no t yet approved or cleared by the [place]  FDA and  has been authorized for detection and/or diagnosis of SARS-CoV-2 by FDA under an Emergency Use Authorization (EUA). This EUA will remain  in effect (meaning this test can be used) for the duration of the COVID-19 declaration under Section 564(b)(1) of the Act,  21 U.S.C.section 360bbb-3(b)(1), unless the authorization is terminated  or revoked sooner.       Influenza A by PCR NEGATIVE NEGATIVE Final   Influenza B by PCR NEGATIVE NEGATIVE Final    Comment: (NOTE) The Xpert Xpress SARS-CoV-2/FLU/RSV plus assay is intended as an aid in the diagnosis of influenza from Nasopharyngeal swab specimens and should not be used as a sole basis for treatment. Nasal washings and aspirates are unacceptable for Xpert Xpress SARS-CoV-2/FLU/RSV testing.  Fact Sheet for Patients: bloggercourse.com  Fact Sheet for Healthcare Providers: seriousbroker.it  This test is not yet approved or cleared by the [place]  FDA and has been authorized for detection and/or diagnosis of SARS-CoV-2 by FDA under an Emergency Use Authorization (EUA). This EUA will remain in effect (meaning this test can be used) for the duration of the COVID-19 declaration under Section 564(b)(1) of the Act, 21 U.S.C. section 360bbb-3(b)(1), unless the authorization is terminated or revoked.     Resp Syncytial Virus by PCR NEGATIVE NEGATIVE Final    Comment: (NOTE) Fact Sheet for Patients: bloggercourse.com  Fact Sheet for Healthcare Providers: seriousbroker.it  This test is not yet approved or cleared by the [place]  FDA and has been authorized for detection and/or diagnosis of SARS-CoV-2 by FDA under an Emergency Use Authorization (EUA). This EUA will remain in effect (meaning this test can be used) for the duration of the COVID-19 declaration under Section 564(b)(1) of the Act, 21 U.S.C. section 360bbb-3(b)(1), unless the authorization is terminated or revoked.  Performed at  [facility], [address], [place], [place] [address]   CSF culture w Gram Stain     Status: None (Preliminary result)   Collection Time: 07/27/24  6:16 PM   Specimen: CSF; Cerebrospinal Fluid   Result Value Ref Range Status   Specimen Description   Final    CSF Performed at [facility], [address], [place], [place] [address]    Special Requests   Final    NONE Performed at [facility], [address], [place], [place] [address]    Gram Stain   Final    CYTOSPIN SMEAR WBC PRESENT, PREDOMINANTLY PMN NO ORGANISMS SEEN Performed at [facility], [address], [place], [place] [address]    Culture   Final    NO GROWTH 2 DAYS Performed at [facility], [address], [place], [place] [address]    Report Status PENDING  Incomplete    Labs: CBC: Recent Labs  Lab 07/27/24 1448 07/28/24 0559 07/29/24 0430  WBC 9.4 7.0 9.4  NEUTROABS 6.4  --   --   HGB 15.5 13.3 12.2*  HCT 45.1 39.1 35.8*  MCV 94.2 93.3 94.2  PLT 370 331 298   Basic Metabolic Panel: Recent Labs  Lab 07/27/24 1448 07/28/24 0559 07/29/24 0430  NA 142 141 143  K 3.6 3.5 3.1*  CL 105 106 109  CO2 22 26 26   GLUCOSE 122* 116* 86  BUN 9 6* 8  CREATININE 0.55* 0.42* 0.56*  CALCIUM 9.8 8.8* 8.5*  MG  --  2.0 2.0  PHOS  --  1.9* 2.7   Liver Function Tests: Recent Labs  Lab 07/27/24 1448 07/28/24 0559  AST 16 15  ALT 5 7  ALKPHOS 61 55  BILITOT 1.1 0.7  PROT 8.2* 6.8  ALBUMIN 4.5 3.9   CBG: Recent Labs  Lab 07/27/24 1433  GLUCAP 128*    Discharge time spent: 37 minutes.  Signed: Concepcion Riser, MD [facility] 07/29/2024

## 2024-07-29 NOTE — Care Management Important Message (Signed)
 Important Message  Patient Details  Name: Michael Bass. MRN: [id number] Date of Birth: [birth year]   Important Message Given:  Yes - Medicare IM     Julio Zappia L Min Tunnell 07/29/2024, 2:29 PM

## 2024-07-29 NOTE — Plan of Care (Signed)
°  Problem: Education: Goal: Knowledge of General Education information will improve Description: Including pain rating scale, medication(s)/side effects and non-pharmacologic comfort measures 07/29/2024 1600 by Mathews Norleen POUR, RN Outcome: Adequate for Discharge 07/29/2024 0911 by Mathews Norleen POUR, RN Outcome: Progressing   Problem: Health Behavior/Discharge Planning: Goal: Ability to manage health-related needs will improve 07/29/2024 1600 by Mathews Norleen POUR, RN Outcome: Adequate for Discharge 07/29/2024 0911 by Mathews Norleen POUR, RN Outcome: Progressing   Problem: Clinical Measurements: Goal: Ability to maintain clinical measurements within normal limits will improve 07/29/2024 1600 by Mathews Norleen POUR, RN Outcome: Adequate for Discharge 07/29/2024 0911 by Mathews Norleen POUR, RN Outcome: Progressing Goal: Will remain free from infection 07/29/2024 1600 by Mathews Norleen POUR, RN Outcome: Adequate for Discharge 07/29/2024 0911 by Mathews Norleen POUR, RN Outcome: Progressing Goal: Diagnostic test results will improve 07/29/2024 1600 by Mathews Norleen POUR, RN Outcome: Adequate for Discharge 07/29/2024 0911 by Mathews Norleen POUR, RN Outcome: Progressing Goal: Respiratory complications will improve 07/29/2024 1600 by Mathews Norleen POUR, RN Outcome: Adequate for Discharge 07/29/2024 0911 by Mathews Norleen POUR, RN Outcome: Progressing Goal: Cardiovascular complication will be avoided 07/29/2024 1600 by Mathews Norleen POUR, RN Outcome: Adequate for Discharge 07/29/2024 0911 by Mathews Norleen POUR, RN Outcome: Progressing   Problem: Activity: Goal: Risk for activity intolerance will decrease 07/29/2024 1600 by Mathews Norleen POUR, RN Outcome: Adequate for Discharge 07/29/2024 0911 by Mathews Norleen POUR, RN Outcome: Progressing   Problem: Nutrition: Goal: Adequate nutrition will be maintained 07/29/2024 1600 by Mathews Norleen POUR, RN Outcome: Adequate for Discharge 07/29/2024 0911 by Mathews Norleen POUR, RN Outcome: Progressing   Problem:  Coping: Goal: Level of anxiety will decrease 07/29/2024 1600 by Mathews Norleen POUR, RN Outcome: Adequate for Discharge 07/29/2024 0911 by Mathews Norleen POUR, RN Outcome: Progressing   Problem: Elimination: Goal: Will not experience complications related to bowel motility 07/29/2024 1600 by Mathews Norleen POUR, RN Outcome: Adequate for Discharge 07/29/2024 0911 by Mathews Norleen POUR, RN Outcome: Progressing Goal: Will not experience complications related to urinary retention 07/29/2024 1600 by Mathews Norleen POUR, RN Outcome: Adequate for Discharge 07/29/2024 0911 by Mathews Norleen POUR, RN Outcome: Progressing   Problem: Pain Managment: Goal: General experience of comfort will improve and/or be controlled 07/29/2024 1600 by Mathews Norleen POUR, RN Outcome: Adequate for Discharge 07/29/2024 0911 by Mathews Norleen POUR, RN Outcome: Progressing   Problem: Safety: Goal: Ability to remain free from injury will improve 07/29/2024 1600 by Mathews Norleen POUR, RN Outcome: Adequate for Discharge 07/29/2024 0911 by Mathews Norleen POUR, RN Outcome: Progressing   Problem: Skin Integrity: Goal: Risk for impaired skin integrity will decrease 07/29/2024 1600 by Mathews Norleen POUR, RN Outcome: Adequate for Discharge 07/29/2024 0911 by Mathews Norleen POUR, RN Outcome: Progressing

## 2024-07-31 LAB — CSF CULTURE W GRAM STAIN: Culture: NO GROWTH

## 2024-08-01 ENCOUNTER — Telehealth: Payer: Self-pay

## 2024-08-01 NOTE — Transitions of Care (Post Inpatient/ED Visit) (Signed)
° °  08/01/2024  Name: Michael E Agerton Jr. MRN: [id number] DOB: 1956/06/27  Today's TOC FU Call Status: Today's TOC FU Call Status:: Unsuccessful Call (1st Attempt) Unsuccessful Call (1st Attempt) Date: 08/01/24  Attempted to reach the patient regarding the most recent Inpatient/ED visit.  Follow Up Plan: Additional outreach attempts will be made to reach the patient to complete the Transitions of Care (Post Inpatient/ED visit) call.   Jalyn Rosero J. Theon Sobotka RN, MSN [facility], [facility] RN Care Manager Direct Dial: [phone]  Fax: [phone] Website: [email or web address]

## 2024-08-02 ENCOUNTER — Telehealth: Payer: Self-pay

## 2024-08-02 NOTE — Transitions of Care (Post Inpatient/ED Visit) (Signed)
° °  08/02/2024  Name: Michael Bass. MRN: [id number] DOB: 1956/02/11  Today's TOC FU Call Status: Today's TOC FU Call Status:: Unsuccessful Call (2nd Attempt) Unsuccessful Call (2nd Attempt) Date: 08/02/24  Attempted to reach the patient regarding the most recent Inpatient/ED visit.  Follow Up Plan: Additional outreach attempts will be made to reach the patient to complete the Transitions of Care (Post Inpatient/ED visit) call.   Mystery Schrupp J. Aldean Pipe RN, MSN [facility], [facility] RN Care Manager Direct Dial: [phone]  Fax: [phone] Website: [email or web address]

## 2024-08-03 ENCOUNTER — Telehealth: Payer: Self-pay

## 2024-08-03 NOTE — Transitions of Care (Post Inpatient/ED Visit) (Signed)
° °  08/03/2024  Name: Michael E Nehring Jr. MRN: [id number] DOB: 1955/09/25  Today's TOC FU Call Status: Today's TOC FU Call Status:: Unsuccessful Call (3rd Attempt) Unsuccessful Call (3rd Attempt) Date: 08/03/24  Attempted to reach the patient regarding the most recent Inpatient/ED visit.  Follow Up Plan: No further outreach attempts will be made at this time. We have been unable to contact the patient.  Trenese Haft J. Paxton Binns RN, MSN [facility], [facility] RN Care Manager Direct Dial: [phone]  Fax: [phone] Website: [email or web address]
